# Patient Record
Sex: Male | Born: 1955 | Race: White | Hispanic: No | Marital: Single | Smoking: Never smoker
Health system: Southern US, Community
[De-identification: ages and names within clinical notes are randomized; demographics above are authoritative.]

## PROBLEM LIST (undated history)

## (undated) DIAGNOSIS — E785 Hyperlipidemia, unspecified: Secondary | ICD-10-CM

## (undated) DIAGNOSIS — I420 Dilated cardiomyopathy: Secondary | ICD-10-CM

## (undated) DIAGNOSIS — M1990 Unspecified osteoarthritis, unspecified site: Secondary | ICD-10-CM

## (undated) DIAGNOSIS — S86111A Strain of other muscle(s) and tendon(s) of posterior muscle group at lower leg level, right leg, initial encounter: Secondary | ICD-10-CM

## (undated) DIAGNOSIS — G4733 Obstructive sleep apnea (adult) (pediatric): Secondary | ICD-10-CM

## (undated) DIAGNOSIS — I493 Ventricular premature depolarization: Secondary | ICD-10-CM

## (undated) DIAGNOSIS — I480 Paroxysmal atrial fibrillation: Secondary | ICD-10-CM

## (undated) DIAGNOSIS — C61 Malignant neoplasm of prostate: Secondary | ICD-10-CM

## (undated) DIAGNOSIS — I10 Essential (primary) hypertension: Secondary | ICD-10-CM

## (undated) DIAGNOSIS — I341 Nonrheumatic mitral (valve) prolapse: Secondary | ICD-10-CM

## (undated) DIAGNOSIS — R931 Abnormal findings on diagnostic imaging of heart and coronary circulation: Secondary | ICD-10-CM

## (undated) HISTORY — PX: APPENDECTOMY: SHX54

## (undated) HISTORY — PX: TONSILLECTOMY: SUR1361

## (undated) HISTORY — DX: Essential (primary) hypertension: I10

## (undated) HISTORY — DX: Paroxysmal atrial fibrillation: I48.0

## (undated) HISTORY — DX: Obstructive sleep apnea (adult) (pediatric): G47.33

## (undated) HISTORY — PX: OTHER SURGICAL HISTORY: SHX169

## (undated) HISTORY — DX: Dilated cardiomyopathy: I42.0

## (undated) HISTORY — PX: PROSTATE BIOPSY: SHX241

## (undated) HISTORY — PX: TENDON TRANSPLANT: SHX2488

## (undated) HISTORY — DX: Hyperlipidemia, unspecified: E78.5

## (undated) HISTORY — DX: Abnormal findings on diagnostic imaging of heart and coronary circulation: R93.1

## (undated) HISTORY — DX: Nonrheumatic mitral (valve) prolapse: I34.1

## (undated) HISTORY — DX: Ventricular premature depolarization: I49.3

---

## 2005-12-18 ENCOUNTER — Emergency Department (HOSPITAL_COMMUNITY): Admission: EM | Admit: 2005-12-18 | Discharge: 2005-12-18 | Payer: Self-pay | Admitting: Emergency Medicine

## 2014-03-26 ENCOUNTER — Other Ambulatory Visit: Payer: Self-pay | Admitting: Orthopedic Surgery

## 2014-03-26 DIAGNOSIS — M542 Cervicalgia: Secondary | ICD-10-CM

## 2014-03-31 ENCOUNTER — Ambulatory Visit
Admission: RE | Admit: 2014-03-31 | Discharge: 2014-03-31 | Disposition: A | Discharge status: Home / Self Care | Payer: 59 | Source: Ambulatory Visit | Attending: Orthopedic Surgery | Admitting: Orthopedic Surgery

## 2014-03-31 DIAGNOSIS — M542 Cervicalgia: Secondary | ICD-10-CM

## 2014-05-12 ENCOUNTER — Encounter: Payer: Self-pay | Admitting: Cardiology

## 2014-05-12 ENCOUNTER — Ambulatory Visit (INDEPENDENT_AMBULATORY_CARE_PROVIDER_SITE_OTHER): Payer: 59 | Admitting: Cardiology

## 2014-05-12 VITALS — BP 114/70 | HR 88 | Ht 71.5 in | Wt 195.4 lb

## 2014-05-12 DIAGNOSIS — I10 Essential (primary) hypertension: Secondary | ICD-10-CM | POA: Insufficient documentation

## 2014-05-12 DIAGNOSIS — I493 Ventricular premature depolarization: Secondary | ICD-10-CM | POA: Insufficient documentation

## 2014-05-12 DIAGNOSIS — I420 Dilated cardiomyopathy: Secondary | ICD-10-CM | POA: Insufficient documentation

## 2014-05-12 DIAGNOSIS — I341 Nonrheumatic mitral (valve) prolapse: Secondary | ICD-10-CM

## 2014-05-12 NOTE — Patient Instructions (Signed)
Your physician has requested that you have an echocardiogram. Echocardiography is a painless test that uses sound waves to create images of your heart. It provides your doctor with information about the size and shape of your heart and how well your heart's chambers and valves are working. This procedure takes approximately one hour. There are no restrictions for this procedure.  Your physician wants you to follow-up in: 1 year with Dr. [name]. You will receive a reminder letter in the mail two months in advance. If you don't receive a letter, please call our office to schedule the follow-up appointment.  

## 2014-05-12 NOTE — Progress Notes (Signed)
  [place], [place], [place]  [address] Phone: [phone] Fax:  [phone]  Date:  05/12/2014   ID:  Jordan Payne, DOB 1955/11/18, MRN [id number]  PCP:  No primary care provider on file.  Cardiologist:  Fransico Him, MD    History of Present Illness: Jordan Payne is a 58 y.o. male with a history of MVP s/p MV repair, HTN and DCM who presents today for followup.  He is doing well.  He denies any chest pain, SOB, DOE, LE edema, dizziness, palpitations or syncope.     Wt Readings from Last 3 Encounters:  05/12/14 195 lb 6.4 oz (88.633 kg)     Past Medical History  Diagnosis Date  . Hypertension   . MVP (mitral valve prolapse)     s/p repair  . PVC's (premature ventricular contractions)   . DCM (dilated cardiomyopathy)     EF 41% by echo 2014    Current Outpatient Prescriptions  Medication Sig Dispense Refill  . lisinopril (PRINIVIL,ZESTRIL) 40 MG tablet Take 40 mg by mouth daily.    . pravastatin (PRAVACHOL) 40 MG tablet Take 40 mg by mouth daily.     No current facility-administered medications for this visit.    Allergies:   No Known Allergies  Social History:  The patient  reports that he has never smoked. He does not have any smokeless tobacco history on file. He reports that he does not drink alcohol or use illicit drugs.   Family History:  The patient's family history includes Deep vein thrombosis in his sister.   ROS:  Please see the history of present illness.      All other systems reviewed and negative.   PHYSICAL EXAM: VS:  BP 114/70 mmHg  Pulse 88  Ht 5' 11.5" (1.816 m)  Wt 195 lb 6.4 oz (88.633 kg)  BMI 26.88 kg/m2 Well nourished, well developed, in no acute distress HEENT: normal Neck: no JVD Cardiac:  normal S1, S2; RRR; no murmur Lungs:  clear to auscultation bilaterally, no wheezing, rhonchi or rales Abd: soft, nontender, no hepatomegaly Ext: no edema Skin: warm and dry Neuro:  CNs 2-12 intact, no focal abnormalities  noted  EKG: NSR with nonspecific T wave abnormality and PVC's      ASSESSMENT AND PLAN:  1. MVP s/p MV repair - doing well.   - will recheck 2D echo to assess MV 2. HTN well controlled - continue Lisinopril 3. DCM - with mild LV dysfunction EF 41% by echo 2014 - continue ACE I  Followup with me in 1 year  Signed, Fransico Him, MD [facility] 05/12/2014 2:03 PM

## 2014-05-20 ENCOUNTER — Ambulatory Visit (HOSPITAL_COMMUNITY): Payer: 59 | Attending: Cardiology | Admitting: Radiology

## 2014-05-20 DIAGNOSIS — I493 Ventricular premature depolarization: Secondary | ICD-10-CM

## 2014-05-20 DIAGNOSIS — I10 Essential (primary) hypertension: Secondary | ICD-10-CM

## 2014-05-20 DIAGNOSIS — I420 Dilated cardiomyopathy: Secondary | ICD-10-CM | POA: Diagnosis present

## 2014-05-20 DIAGNOSIS — I341 Nonrheumatic mitral (valve) prolapse: Secondary | ICD-10-CM

## 2014-05-20 NOTE — Progress Notes (Signed)
Echocardiogram performed.  

## 2014-05-25 ENCOUNTER — Encounter: Payer: Self-pay | Admitting: *Deleted

## 2014-05-28 ENCOUNTER — Telehealth: Payer: Self-pay

## 2014-05-28 DIAGNOSIS — I420 Dilated cardiomyopathy: Secondary | ICD-10-CM

## 2014-05-28 MED ORDER — CARVEDILOL 3.125 MG PO TABS
3.1250 mg | ORAL_TABLET | Freq: Two times a day (BID) | ORAL | Status: DC
Start: 2014-05-28 — End: 2014-06-18

## 2014-05-28 NOTE — Telephone Encounter (Signed)
-----   Message from Sueanne Margarita, MD sent at 05/21/2014  8:50 PM EST ----- Please let patient know that echo showed mild LV dysfunction with increased filing pressures.  Please have him come in for a BNP.  Also, please add Coreg 3.125mg  BID to help with diastolic dysfunction

## 2014-05-28 NOTE — Telephone Encounter (Signed)
Informed patient he has mild LV dysfunction and increased filling pressures.  Instructed patient to start Coreg 3.125 mg BID to help with diastolic dysfunction. Lab appointment for BNP made for Tuesday, November 24. Patient agrees with treatment plan.

## 2014-05-30 ENCOUNTER — Other Ambulatory Visit (INDEPENDENT_AMBULATORY_CARE_PROVIDER_SITE_OTHER): Payer: 59 | Admitting: *Deleted

## 2014-05-30 DIAGNOSIS — I420 Dilated cardiomyopathy: Secondary | ICD-10-CM

## 2014-05-30 LAB — BRAIN NATRIURETIC PEPTIDE: BRAIN NATRIURETIC PEPTIDE: 21.2 pg/mL (ref 0.0–100.0)

## 2014-06-03 ENCOUNTER — Other Ambulatory Visit: Payer: 59

## 2014-06-18 ENCOUNTER — Other Ambulatory Visit: Payer: Self-pay | Admitting: *Deleted

## 2014-06-18 MED ORDER — CARVEDILOL 3.125 MG PO TABS: 3.1250 mg | ORAL_TABLET | Freq: Two times a day (BID) | ORAL | Status: DC

## 2014-07-08 ENCOUNTER — Telehealth: Payer: Self-pay | Admitting: Cardiology

## 2014-07-08 DIAGNOSIS — R079 Chest pain, unspecified: Secondary | ICD-10-CM

## 2014-07-08 NOTE — Telephone Encounter (Signed)
Please order an ETT

## 2014-07-08 NOTE — Telephone Encounter (Signed)
New msg       Pt calling, had brief episode of chest pain on Saturday after exercising.   States it was very brief about 10 seconds.   Pt concerned he's having side effects of carvedilol.  Please call to discuss.

## 2014-07-08 NOTE — Telephone Encounter (Signed)
Pt st he has concerns about his cardiac state. He has been exercising more frequently recently and Saturday he went for a bike ride.  A few hours later, he had a short episode (5-10 sec) of chest pain. He st his BP spiked right after to 155/90, when it is usually 118/80. He st he rested for a few hours and was on his feet until 0230 in the morning Saturday and Sunday at a show. He st today he felt a little "off". But he has not had any CP. He is anxious to increase his exercise. He thinks his issue might be due to his carvedilol.  Patient wants to have testing done to see what happened. He is asymptomatic now and has been symptomatic since the episode Saturday.  To Dr. Radford Pax for review and recommendations.

## 2014-07-09 NOTE — Telephone Encounter (Signed)
Informed patient of Dr. Theodosia Blender recommendations and patient agrees. GXT ordered for scheduling.

## 2014-07-25 ENCOUNTER — Telehealth: Payer: Self-pay | Admitting: Cardiology

## 2014-07-25 DIAGNOSIS — R002 Palpitations: Secondary | ICD-10-CM

## 2014-07-25 NOTE — Telephone Encounter (Signed)
Please have him decrease Lisinopril to 20mg  daily and get a 30 day heart monitor to assess his palpitations.  Continue coreg for now due to LV dysfunction

## 2014-07-25 NOTE — Telephone Encounter (Signed)
Patient st that after he rides his bike he feels PVC's. He is concerned about his cardiac medications and thinks that his Coreg is causing his issues after researching on the Internet. He also thinks his lisinopril dose is too high, as his blood pressure was 96/60 the other day.  Instructed the patient to check his BP before he takes his medications and to hold the lisinopril if it is low until we follow-up with him.  Message sent to [facility] to see if there are any closer ETT openings than his.

## 2014-07-25 NOTE — Telephone Encounter (Signed)
New message     Patient calling still having issues after exercise & review test results.

## 2014-07-29 MED ORDER — LISINOPRIL 20 MG PO TABS: 20.0000 mg | ORAL_TABLET | Freq: Every day | ORAL | Status: DC

## 2014-07-29 NOTE — Telephone Encounter (Signed)
Instructed patient to DECREASE lisinopril to 20 mg daily and to continue cored due to LV dysfunction. 30 day event monitor ordered for scheduling. Patient agrees with treatment plan.

## 2014-07-30 ENCOUNTER — Ambulatory Visit (HOSPITAL_COMMUNITY)
Admission: RE | Admit: 2014-07-30 | Discharge: 2014-07-30 | Disposition: A | Discharge status: Home / Self Care | Payer: 59 | Source: Ambulatory Visit | Attending: Cardiology | Admitting: Cardiology

## 2014-07-30 DIAGNOSIS — R079 Chest pain, unspecified: Secondary | ICD-10-CM | POA: Diagnosis not present

## 2014-07-31 ENCOUNTER — Encounter: Payer: Self-pay | Admitting: *Deleted

## 2014-07-31 ENCOUNTER — Encounter (INDEPENDENT_AMBULATORY_CARE_PROVIDER_SITE_OTHER): Payer: 59

## 2014-07-31 DIAGNOSIS — R002 Palpitations: Secondary | ICD-10-CM

## 2014-07-31 NOTE — Progress Notes (Signed)
Patient ID: Jordan Payne, male   DOB: 1956/03/17, 59 y.o.   MRN: [id number] [employer] 30 day cardiac event monitor applied to patient.

## 2014-08-07 ENCOUNTER — Encounter (HOSPITAL_COMMUNITY): Payer: 59

## 2014-09-10 ENCOUNTER — Telehealth: Payer: Self-pay | Admitting: Cardiology

## 2014-09-10 NOTE — Telephone Encounter (Signed)
Please let patient know that heart monitor showed NSR with PVC's and PAC's which are benign.  HR ranged from 62 to 142 bpm.  Please find out if patient exercises around 5-6pm at night

## 2014-09-12 NOTE — Telephone Encounter (Signed)
Patient informed of monitor results and verbal understanding expressed.   Patient does work out every other day around 6 PM.  He is still concerned about symptoms he continues to have. He says he does not have chest pain, but does complain of "discomfort," especially about 3-4 hours after or the next day after working out. It is hardly ever during exercise. Patient wanted Dr. Radford Pax to know that when he had his MVR repair about 15 years ago, his doctor told him not to lift heavy weights.  He says he lifts 10 pound weights when he does lift. He does not know if this is causing the discomfort.    To Dr. Radford Pax for recommendations.

## 2014-09-14 NOTE — Telephone Encounter (Signed)
Did patient ever get stress test done?

## 2014-09-15 NOTE — Telephone Encounter (Signed)
Patient informed of stress test results and verbal understanding expressed.   Patient agrees to see his PCP to F/U on symptoms.  Patient c/o slight leg numbness every now and then and "weird dreams" that wake him up at night and blames Coreg.  He requests Dr. Radford Pax know his symptoms.

## 2014-09-15 NOTE — Telephone Encounter (Signed)
Patient's stress test was normal.  His symptoms may be from musculoskeletal etiology - please have his see his PCP

## 2014-09-15 NOTE — Telephone Encounter (Signed)
Stress test results found in 07/30/2014 encounter under "Linked documents" - view image.

## 2014-09-16 NOTE — Telephone Encounter (Signed)
I would have him see if PCP for the leg numbness.

## 2014-09-16 NOTE — Telephone Encounter (Signed)
Instructed patient to see PCP for leg numbness. Patient agrees with treatment plan.

## 2015-02-18 ENCOUNTER — Encounter: Payer: Self-pay | Admitting: Cardiology

## 2015-05-12 ENCOUNTER — Ambulatory Visit: Payer: 59

## 2015-06-28 ENCOUNTER — Other Ambulatory Visit: Payer: Self-pay | Admitting: Cardiology

## 2015-07-17 ENCOUNTER — Ambulatory Visit: Payer: 59 | Admitting: Cardiology

## 2015-09-07 ENCOUNTER — Encounter: Payer: Self-pay | Admitting: Cardiology

## 2015-09-07 DIAGNOSIS — I341 Nonrheumatic mitral (valve) prolapse: Secondary | ICD-10-CM | POA: Insufficient documentation

## 2015-09-07 NOTE — Progress Notes (Signed)
Cardiology Office Note   Date:  09/08/2015   ID:  Jordan Payne, DOB May 09, 1956, MRN [id number]  PCP:  No PCP Per Patient    Chief Complaint  Patient presents with  . Mitral Valve Prolapse  . Hypertension  . Cardiomyopathy      History of Present Illness: Jordan Payne is a 60 y.o. male with a history of MVP s/p MV repair, HTN and DCM who presents today for followup. He is doing well. He denies any chest pain, SOB, DOE, LE edema, dizziness, palpitations or syncope. He is trying to follow a strict diet.  He rides the bike and walks on the treadmill at the gym.  He is worried he may have some sleep apnea.  He snores at night and feels that he does not sleep well.  He feels tired on some days in the am.      Past Medical History  Diagnosis Date  . Hypertension   . MVP (mitral valve prolapse)     s/p repair  . PVC's (premature ventricular contractions)   . DCM (dilated cardiomyopathy) ([place])     EF 40-45% by echo 2015    History reviewed. No pertinent past surgical history.   Current Outpatient Prescriptions  Medication Sig Dispense Refill  . carvedilol (COREG) 3.125 MG tablet TAKE 1 TABLET TWICE A DAY 180 tablet 0  . Cholecalciferol (VITAMIN D) 2000 units CAPS Take 1 capsule by mouth daily.    Marland Kitchen GLUCOSAMINE CHONDROITIN COMPLX PO Take 1 tablet by mouth 2 (two) times daily. Dosage 1500-1200mg     . lisinopril (PRINIVIL,ZESTRIL) 20 MG tablet Take 1 tablet (20 mg total) by mouth daily.    . Multiple Vitamins-Minerals (MULTIVITAMIN & MINERAL PO) Take 1 tablet by mouth daily.    . pravastatin (PRAVACHOL) 40 MG tablet Take 40 mg by mouth daily.    Marland Kitchen tobramycin (TOBREX) 0.3 % ophthalmic solution ADMINISTER 1-2 DROPS TO BOTH EYES EVERY 4 HOURS FOR 7 DAYS  0   No current facility-administered medications for this visit.    Allergies:   Review of patient's allergies indicates no known allergies.    Social History:  The patient  reports that he has never  smoked. He does not have any smokeless tobacco history on file. He reports that he does not drink alcohol or use illicit drugs.   Family History:  The patient's family history includes Deep vein thrombosis in his sister.    ROS:  Please see the history of present illness.   Otherwise, review of systems are positive for none.   All other systems are reviewed and negative.    PHYSICAL EXAM: VS:  BP 130/88 mmHg  Pulse 70  Ht 5\' 11"  (1.803 m)  Wt 197 lb 6.4 oz (89.54 kg)  BMI 27.54 kg/m2 , BMI Body mass index is 27.54 kg/(m^2). GEN: Well nourished, well developed, in no acute distress HEENT: normal Neck: no JVD, carotid bruits, or masses Cardiac: RRR; no murmurs, rubs, or gallops,no edema  Respiratory:  clear to auscultation bilaterally, normal work of breathing GI: soft, nontender, nondistended, + BS MS: no deformity or atrophy Skin: warm and dry, no rash Neuro:  Strength and sensation are intact Psych: euthymic mood, full affect   EKG:  EKG was ordered today showing NSR with no ST changes    Recent Labs: No results found for requested labs within last 365 days.  Lipid Panel No results found for: CHOL, TRIG, HDL, CHOLHDL, VLDL, LDLCALC, LDLDIRECT    Wt Readings from Last 3 Encounters:  09/08/15 197 lb 6.4 oz (89.54 kg)  05/12/14 195 lb 6.4 oz (88.633 kg)     ASSESSMENT AND PLAN:  1. MVP s/p MV repair - doing well. - will recheck 2D echo to assess MV 2. HTN well controlled - continue Lisinopril/Coreg 3. DCM - with mild LV dysfunction EF 45% by echo 2015 - continue ACE I/BB   4.   Excessive daytime sleepiness - he has had some snoring as well and feels he is not sleeping as restful.  I will get a home sleep study.    Current medicines are reviewed at length with the patient today.  The patient does not have concerns regarding medicines.  The following changes have been made:  no change  Labs/ tests ordered today: See above Assessment and Plan No orders of  the defined types were placed in this encounter.     Disposition:   FU with me in 1 year  Signed, Sueanne Margarita, MD  09/08/2015 10:37 AM    [facility] [place], [place], [place]  [address] Phone: [phone]; Fax: [phone]

## 2015-09-08 ENCOUNTER — Encounter: Payer: Self-pay | Admitting: Cardiology

## 2015-09-08 ENCOUNTER — Ambulatory Visit (INDEPENDENT_AMBULATORY_CARE_PROVIDER_SITE_OTHER): Payer: 59 | Admitting: Cardiology

## 2015-09-08 VITALS — BP 130/88 | HR 70 | Ht 71.0 in | Wt 197.4 lb

## 2015-09-08 DIAGNOSIS — I10 Essential (primary) hypertension: Secondary | ICD-10-CM | POA: Diagnosis not present

## 2015-09-08 DIAGNOSIS — I341 Nonrheumatic mitral (valve) prolapse: Secondary | ICD-10-CM

## 2015-09-08 DIAGNOSIS — R0683 Snoring: Secondary | ICD-10-CM

## 2015-09-08 DIAGNOSIS — I420 Dilated cardiomyopathy: Secondary | ICD-10-CM

## 2015-09-08 DIAGNOSIS — G4719 Other hypersomnia: Secondary | ICD-10-CM

## 2015-09-08 NOTE — Patient Instructions (Signed)
Medication Instructions:  Your physician recommends that you continue on your current medications as directed. Please refer to the Current Medication list given to you today.   Labwork: None  Testing/Procedures: Your physician has requested that you have an echocardiogram. Echocardiography is a painless test that uses sound waves to create images of your heart. It provides your doctor with information about the size and shape of your heart and how well your heart's chambers and valves are working. This procedure takes approximately one hour. There are no restrictions for this procedure.  Dr. Radford Pax recommends you have a [place].  Follow-Up: Your physician wants you to follow-up in: 1 year with Dr. Radford Pax. You will receive a reminder letter in the mail two months in advance. If you don't receive a letter, please call our office to schedule the follow-up appointment.   Any Other Special Instructions Will Be Listed Below (If Applicable).     If you need a refill on your cardiac medications before your next appointment, please call your pharmacy.

## 2015-09-22 ENCOUNTER — Ambulatory Visit (HOSPITAL_COMMUNITY): Payer: 59

## 2015-09-23 ENCOUNTER — Ambulatory Visit (HOSPITAL_COMMUNITY): Payer: 59 | Attending: Cardiology

## 2015-09-23 ENCOUNTER — Other Ambulatory Visit: Payer: Self-pay

## 2015-09-23 DIAGNOSIS — I420 Dilated cardiomyopathy: Secondary | ICD-10-CM | POA: Diagnosis not present

## 2015-09-23 DIAGNOSIS — I119 Hypertensive heart disease without heart failure: Secondary | ICD-10-CM | POA: Diagnosis not present

## 2015-09-23 DIAGNOSIS — I77810 Thoracic aortic ectasia: Secondary | ICD-10-CM | POA: Insufficient documentation

## 2015-10-14 ENCOUNTER — Other Ambulatory Visit: Payer: Self-pay | Admitting: Cardiology

## 2015-11-26 ENCOUNTER — Emergency Department (HOSPITAL_COMMUNITY): Payer: 59

## 2015-11-26 ENCOUNTER — Encounter (HOSPITAL_COMMUNITY): Payer: Self-pay

## 2015-11-26 ENCOUNTER — Emergency Department (HOSPITAL_COMMUNITY)
Admission: EM | Admit: 2015-11-26 | Discharge: 2015-11-26 | Disposition: A | Discharge status: Home / Self Care | Payer: 59 | Attending: Emergency Medicine | Admitting: Emergency Medicine

## 2015-11-26 DIAGNOSIS — Z792 Long term (current) use of antibiotics: Secondary | ICD-10-CM | POA: Diagnosis not present

## 2015-11-26 DIAGNOSIS — R079 Chest pain, unspecified: Secondary | ICD-10-CM | POA: Insufficient documentation

## 2015-11-26 DIAGNOSIS — R0789 Other chest pain: Secondary | ICD-10-CM

## 2015-11-26 DIAGNOSIS — I10 Essential (primary) hypertension: Secondary | ICD-10-CM | POA: Insufficient documentation

## 2015-11-26 DIAGNOSIS — M549 Dorsalgia, unspecified: Secondary | ICD-10-CM | POA: Diagnosis not present

## 2015-11-26 DIAGNOSIS — Z79899 Other long term (current) drug therapy: Secondary | ICD-10-CM | POA: Insufficient documentation

## 2015-11-26 LAB — I-STAT TROPONIN, ED
TROPONIN I, POC: 0 ng/mL (ref 0.00–0.08)
TROPONIN I, POC: 0.01 ng/mL (ref 0.00–0.08)

## 2015-11-26 LAB — BASIC METABOLIC PANEL
Anion gap: 10 (ref 5–15)
BUN: 17 mg/dL (ref 6–20)
CO2: 25 mmol/L (ref 22–32)
Calcium: 9.2 mg/dL (ref 8.9–10.3)
Chloride: 102 mmol/L (ref 101–111)
Creatinine, Ser: 0.92 mg/dL (ref 0.61–1.24)
GFR calc Af Amer: >60 mL/min (ref >60)
Glucose, Bld: 101 mg/dL — ABNORMAL HIGH (ref 65–99)
POTASSIUM: 3.9 mmol/L (ref 3.5–5.1)
SODIUM: 137 mmol/L (ref 135–145)

## 2015-11-26 LAB — CBC
HEMATOCRIT: 47.8 % (ref 39.0–52.0)
Hemoglobin: 16.1 g/dL (ref 13.0–17.0)
MCH: 31.3 pg (ref 26.0–34.0)
MCHC: 33.7 g/dL (ref 30.0–36.0)
MCV: 92.8 fL (ref 78.0–100.0)
PLATELETS: 268 10*3/uL (ref 150–400)
RBC: 5.15 MIL/uL (ref 4.22–5.81)
RDW: 12.2 % (ref 11.5–15.5)
WBC: 8.7 10*3/uL (ref 4.0–10.5)

## 2015-11-26 MED ORDER — SODIUM CHLORIDE 0.9 % IV BOLUS (SEPSIS)
1000.0000 mL | Freq: Once | INTRAVENOUS | Status: AC
  Administered 2015-11-26: 1000 mL via INTRAVENOUS

## 2015-11-26 MED ORDER — LORAZEPAM 2 MG/ML IJ SOLN
0.5000 mg | Freq: Once | INTRAMUSCULAR | Status: AC
  Administered 2015-11-26: 0.5 mg via INTRAVENOUS
  Filled 2015-11-26: qty 1

## 2015-11-26 NOTE — ED Notes (Signed)
Patient here with chest pain  That started at 0700 this am. Describes as heaviness with no radiation. Thinks his elevated BP and CP related to taking prednisone for back strain. No pain on arrival

## 2015-11-26 NOTE — ED Provider Notes (Signed)
CSN: [id number]     Arrival date & time 11/26/15  [id number] History   First MD Initiated Contact with Patient 11/26/15 1001     Chief Complaint  Patient presents with  . Chest Pain     (Consider location/radiation/quality/duration/timing/severity/associated sxs/prior Treatment) Patient is a 60 y.o. male presenting with chest pain. The history is provided by the patient and medical records. No language interpreter was used.  Chest Pain Pain location:  L chest Pain quality comment:  Cramping Pain radiates to:  Does not radiate Pain radiates to the back: no   Pain severity:  Mild Onset quality:  Gradual Duration:  1 day Timing:  Intermittent Progression:  Waxing and waning Chronicity:  New Context comment:  Patient woke up from sleep this morning with chest discomfort. Worsened today at work during a conference call. Recent back injury after heavy lifting and has been taking prednisone taper. Patient feels chest pain may be musculoskeletal.  Relieved by:  Nothing Worsened by:  Nothing tried Ineffective treatments:  None tried Associated symptoms: back pain   Associated symptoms: no abdominal pain, no cough, no dizziness, no fatigue, no fever, no headache, no lower extremity edema, no nausea, no palpitations, no shortness of breath, no syncope and not vomiting   Associated symptoms comment:  Felt hot and flushed at work during chest pain Risk factors: high cholesterol, hypertension and male sex   Risk factors: no diabetes mellitus, no prior DVT/PE and no smoking     Past Medical History  Diagnosis Date  . Hypertension   . MVP (mitral valve prolapse)     s/p repair  . PVC's (premature ventricular contractions)   . DCM (dilated cardiomyopathy) ([place])     EF 40-45% by echo 2015   History reviewed. No pertinent past surgical history. Family History  Problem Relation Age of Onset  . Deep vein thrombosis Sister    Social History  Substance Use Topics  . Smoking status: Never Smoker    . Smokeless tobacco: None  . Alcohol Use: No    Review of Systems  Constitutional: Negative for fever and fatigue.  HENT: Negative for congestion and rhinorrhea.   Eyes: Negative for visual disturbance.  Respiratory: Negative for cough and shortness of breath.   Cardiovascular: Positive for chest pain. Negative for palpitations and syncope.  Gastrointestinal: Negative for nausea, vomiting, abdominal pain, diarrhea and constipation.  Genitourinary: Positive for frequency. Negative for dysuria and hematuria.  Musculoskeletal: Positive for back pain. Negative for neck pain.  Neurological: Negative for dizziness, syncope and headaches.  Psychiatric/Behavioral: Negative for confusion.      Allergies  Review of patient's allergies indicates no known allergies.  Home Medications   Prior to Admission medications   Medication Sig Start Date End Date Taking? Authorizing Provider  carvedilol (COREG) 3.125 MG tablet Take 1 tablet (3.125 mg total) by mouth 2 (two) times daily. 10/14/15   Sueanne Margarita, MD  Cholecalciferol (VITAMIN D) 2000 units CAPS Take 1 capsule by mouth daily.    Historical Provider, MD  GLUCOSAMINE CHONDROITIN COMPLX PO Take 1 tablet by mouth 2 (two) times daily. Dosage 1500-1200mg     Historical Provider, MD  lisinopril (PRINIVIL,ZESTRIL) 20 MG tablet Take 1 tablet (20 mg total) by mouth daily. 07/29/14   Sueanne Margarita, MD  Multiple Vitamins-Minerals (MULTIVITAMIN & MINERAL PO) Take 1 tablet by mouth daily.    Historical Provider, MD  pravastatin (PRAVACHOL) 40 MG tablet Take 40 mg by mouth daily.    Historical Provider,  MD  tobramycin (TOBREX) 0.3 % ophthalmic solution ADMINISTER 1-2 DROPS TO BOTH EYES EVERY 4 HOURS FOR 7 DAYS 08/29/15   Historical Provider, MD   BP 145/111 mmHg  Pulse 85  Temp(Src) 97.9 F (36.6 C) (Oral)  Resp 18  Ht 5\' 11"  (1.803 m)  Wt 86.183 kg  BMI 26.51 kg/m2  SpO2 100% Physical Exam  Constitutional: He is oriented to person, place, and  time. He appears well-developed and well-nourished.  HENT:  Head: Normocephalic and atraumatic.  Eyes: EOM are normal. Pupils are equal, round, and reactive to light.  Neck: Normal range of motion. Neck supple.  Cardiovascular: Normal rate and intact distal pulses.   No murmur heard. Pulmonary/Chest: Effort normal and breath sounds normal. No respiratory distress. He exhibits no tenderness.  Abdominal: Soft. He exhibits no distension. There is no tenderness.  Musculoskeletal: Normal range of motion. He exhibits no edema or tenderness.  Neurological: He is alert and oriented to person, place, and time.  Skin: Skin is warm and dry. No rash noted.  Psychiatric: He has a normal mood and affect.  Nursing note and vitals reviewed.   ED Course  Procedures (including critical care time) Labs Review Labs Reviewed  BASIC METABOLIC PANEL  [place], ED    Imaging Review No results found. I have personally reviewed and evaluated these images and lab results as part of my medical decision-making.   EKG Interpretation   Date/Time:  Thursday Nov 26 2015 10:00:52 EDT Ventricular Rate:  82 PR Interval:  140 QRS Duration: 96 QT Interval:  356 QTC Calculation: 415 R Axis:   65 Text Interpretation:  Sinus rhythm with Premature atrial complexes with  Abberant conduction Otherwise normal ECG non specific t wave change in v6  Otherwise no significant change Confirmed by FLOYD MD, Quillian Quince [id number]) on  11/26/2015 10:01:45 AM      MDM   Final diagnoses:  Chest pain of uncertain etiology    Patient is a 60 year old male with past medical history of hypertension, hyperlipidemia, Mitral valve repair who presents with chest pain since this morning. Had some associated flushing but no radiation, nausea, shortness of breath or other symptoms. Likely atypical chest pain. Patient is currently being treated with steroid taper for sciatic back pain after lifting a heavy table. He is  afebrile, normotensive with otherwise stable vital signs on arrival. Physical exam is unremarkable. No unilateral calf tenderness or swelling. Reports pain has mostly resolved at this time. Discomfort appears to be reproducible with certain positions. Patient is not tachypneic, tachycardic, or hypoxic and is not complaining of pleuritic chest pain. He does not have a prior history of bleeding or clotting problems. He is low risk for pulmonary embolism by Wells criteria. No leukocytosis, CXR changes, or symptoms of pneumonia or other cardiopulmonary infection on CXR. Because of age and risk factors of this patient ACS was ruled out cultures are pending. Troponins are unremarkable at 0 and 3 hours. Patient is tolerating by mouth daily without difficulty. At this time the cause of patient's chest is unclear, but doubt emergent causes. Likely musculoskeletal pain after overexertion. Patient discharged in stable condition. Strict return precautions were discussed, patient will return for worsening chest pain associated with syncope, shortness of breath, radiation to the back or arms or other new or worsening symptoms. Will follow up with his primary doctor for reevaluation.  Patient was seen and discussed with Dr. Tyrone Nine, ED attending.     Gibson Ramp, MD 11/26/15  [place], DO 11/27/15 1611

## 2015-11-26 NOTE — Discharge Instructions (Signed)

## 2016-08-26 ENCOUNTER — Encounter: Payer: Self-pay | Admitting: *Deleted

## 2016-09-07 ENCOUNTER — Ambulatory Visit (INDEPENDENT_AMBULATORY_CARE_PROVIDER_SITE_OTHER): Payer: 59 | Admitting: Cardiology

## 2016-09-07 ENCOUNTER — Encounter: Payer: Self-pay | Admitting: Cardiology

## 2016-09-07 VITALS — BP 130/98 | HR 72 | Ht 71.0 in | Wt 198.0 lb

## 2016-09-07 DIAGNOSIS — I10 Essential (primary) hypertension: Secondary | ICD-10-CM | POA: Diagnosis not present

## 2016-09-07 DIAGNOSIS — G4719 Other hypersomnia: Secondary | ICD-10-CM | POA: Diagnosis not present

## 2016-09-07 DIAGNOSIS — I420 Dilated cardiomyopathy: Secondary | ICD-10-CM | POA: Diagnosis not present

## 2016-09-07 DIAGNOSIS — I341 Nonrheumatic mitral (valve) prolapse: Secondary | ICD-10-CM | POA: Diagnosis not present

## 2016-09-07 MED ORDER — CARVEDILOL 6.25 MG PO TABS: 6.2500 mg | ORAL_TABLET | Freq: Two times a day (BID) | ORAL | 3 refills | Status: DC

## 2016-09-07 NOTE — Progress Notes (Signed)
Cardiology Office Note    Date:  09/07/2016   ID:  Jordan Payne, DOB 1956/05/30, MRN [id number]  PCP:  Melinda Crutch, MD  Cardiologist:  Fransico Him, MD   Chief Complaint  Patient presents with  . Follow-up    MVP, HTN, DCM    History of Present Illness:  Jordan Payne is a 61 y.o. male with a history of MVP s/p MV repair, HTN and DCM who presents today for followup. He is doing well. He denies any chest pain, SOB, DOE,  PND, orthopnea, gasping for breath at night, LE edema, dizziness, palpitations or syncope. He is trying to follow a strict diet.  He rides the bike and walks on the treadmill at the gym.  The last time I saw him he was worried he may have some sleep apnea.  He snores at night.  He had a home sleep study a year ago but we never got the results.    Past Medical History:  Diagnosis Date  . DCM (dilated cardiomyopathy) ([place])    EF 40-45% by echo 2015  . Hypertension   . MVP (mitral valve prolapse)    s/p repair  . PVC's (premature ventricular contractions)     No past surgical history on file.  Current Medications: Current Meds  Medication Sig  . carvedilol (COREG) 6.25 MG tablet Take 1 tablet (6.25 mg total) by mouth 2 (two) times daily.  . cetirizine (ZYRTEC) 10 MG tablet Take 10 mg by mouth daily.  . Cholecalciferol (VITAMIN D) 2000 units CAPS Take 1 capsule by mouth daily.  Marland Kitchen lisinopril (PRINIVIL,ZESTRIL) 20 MG tablet Take 1 tablet (20 mg total) by mouth daily.  Marland Kitchen MEGARED OMEGA-3 KRILL OIL 500 MG CAPS Take by mouth daily.  . Multiple Vitamins-Minerals (MULTIVITAMIN & MINERAL PO) Take 1 tablet by mouth daily.  . pravastatin (PRAVACHOL) 40 MG tablet Take 40 mg by mouth daily.  . [DISCONTINUED] carvedilol (COREG) 3.125 MG tablet Take 1 tablet (3.125 mg total) by mouth 2 (two) times daily.    Allergies:   Patient has no known allergies.   Social History   Social History  . Marital status: Single    Spouse name: N/A  . Number of children: N/A  .  Years of education: N/A   Social History Main Topics  . Smoking status: Never Smoker  . Smokeless tobacco: Never Used  . Alcohol use No  . Drug use: No  . Sexual activity: Not Asked   Other Topics Concern  . None   Social History Narrative  . None     Family History:  The patient's family history includes Deep vein thrombosis in his sister.   ROS:   Please see the history of present illness.    ROS All other systems reviewed and are negative.  No flowsheet data found.     PHYSICAL EXAM:   VS:  BP (!) 130/98   Pulse 72   Ht 5\' 11"  (1.803 m)   Wt 198 lb (89.8 kg)   BMI 27.62 kg/m    GEN: Well nourished, well developed, in no acute distress  HEENT: normal  Neck: no JVD, carotid bruits, or masses Cardiac: RRR; no murmurs, rubs, or gallops,no edema.  Intact distal pulses bilaterally.  Respiratory:  clear to auscultation bilaterally, normal work of breathing GI: soft, nontender, nondistended, + BS MS: no deformity or atrophy  Skin: warm and dry, no rash Neuro:  Alert and Oriented x 3, Strength and sensation are intact Psych:  euthymic mood, full affect  Wt Readings from Last 3 Encounters:  09/07/16 198 lb (89.8 kg)  11/26/15 190 lb (86.2 kg)  09/08/15 197 lb 6.4 oz (89.5 kg)      Studies/Labs Reviewed:   EKG:  EKG is ordered today.    Recent Labs: 11/26/2015: BUN 17; Creatinine, Ser 0.92; Hemoglobin 16.1; Platelets 268; Potassium 3.9; Sodium 137   Lipid Panel No results found for: CHOL, TRIG, HDL, CHOLHDL, VLDL, LDLCALC, LDLDIRECT  Additional studies/ records that were reviewed today include:  none    ASSESSMENT:    1. Mitral valve prolapse   2. Essential hypertension   3. DCM (dilated cardiomyopathy) ([place])   4. Excessive daytime sleepiness      PLAN:  In order of problems listed above:  1.  MVP s/p MV repair - doing well.   - will recheck 2D echo to assess MV  2.  HTN - BP borderline controlled.  He is not salting his food at home but eats  out a lot. He ate [place] yesterday.   - continue Lisinopril and BB. - Increase Coreg to 6.25mg  BID and followup in HTN clinic in 1 week.    3.  DCM - with mild LV dysfunction EF 41% by echo 2014 and normalized at 55% by echo 09/2015. - continue ACE I and BB.    4.  Excessive daytime sleepiness - He had a a home sleep study a year ago but the results were never sent to [place].  I will call the company to get the results and notify the patient     Medication Adjustments/Labs and Tests Ordered: Current medicines are reviewed at length with the patient today.  Concerns regarding medicines are outlined above.  Medication changes, Labs and Tests ordered today are listed in the Patient Instructions below.  Patient Instructions  Medication Instructions:  1) INCREASE COREG to 6.25 mg TWICE DAILY  Labwork: None  Testing/Procedures: None  Follow-Up: You have an appointment scheduled in the HTN CLINIC on Monday, September 19, 2016 at 2:00PM.  Your physician wants you to follow-up in: 1 year with Dr. Radford Pax. You will receive a reminder letter in the mail two months in advance. If you don't receive a letter, please call our office to schedule the follow-up appointment.   Any Other Special Instructions Will Be Listed Below (If Applicable). Please check your blood pressure daily and bring your readings to the appointment 3/12.    If you need a refill on your cardiac medications before your next appointment, please call your pharmacy.      Signed, Fransico Him, MD  09/07/2016 1:30 PM    [facility] [address], [place], [place]  [address] Phone: [phone]; Fax: [phone]

## 2016-09-07 NOTE — Patient Instructions (Addendum)
Medication Instructions:  1) INCREASE COREG to 6.25 mg TWICE DAILY  Labwork: None  Testing/Procedures: None  Follow-Up: You have an appointment scheduled in the HTN CLINIC on Monday, September 19, 2016 at 2:00PM.  Your physician wants you to follow-up in: 1 year with Dr. Radford Pax. You will receive a reminder letter in the mail two months in advance. If you don't receive a letter, please call our office to schedule the follow-up appointment.   Any Other Special Instructions Will Be Listed Below (If Applicable). Please check your blood pressure daily and bring your readings to the appointment 3/12.    If you need a refill on your cardiac medications before your next appointment, please call your pharmacy.

## 2016-09-12 ENCOUNTER — Telehealth: Payer: Self-pay | Admitting: *Deleted

## 2016-09-12 NOTE — Telephone Encounter (Signed)
Called the patient with his results, he verbalized understanding and stated he is not having ongoing daytime sleepiness but he is having the snoring

## 2016-09-12 NOTE — Telephone Encounter (Signed)
-----   Message from Sueanne Margarita, MD sent at 09/08/2016  4:22 PM EST ----- Normal home sleep study - if patient has ongoing daytime sleepiness and snoring then needs in lab sleep study to completely rule out OSA

## 2016-09-19 ENCOUNTER — Telehealth: Payer: Self-pay | Admitting: Pharmacist

## 2016-09-19 ENCOUNTER — Ambulatory Visit: Payer: 59

## 2016-09-19 NOTE — Progress Notes (Deleted)
Patient ID: Jordan Payne                 DOB: 1955-10-12                      MRN: [id number]     HPI: Bowe Sidor is a 61 y.o. male referred by Dr. Radford Pax to HTN clinic. PMH is significant for MVP s/p MV repair, HTN, and DCM (LVEF 55% on 09/2015 echo). At last visit, BP was elevated at 130/98 and pt was advised to increase his carvedilol to 6.25g BID. He presents today for follow up.   Current HTN meds: lisinopril 20mg  daily, carvedilol 6.25mg  BID BP goal: <130/58mmHg  Family History: DVT in his sister.  Social History: Denies tobacco, alcohol, and illicit drug use.  Diet:   Exercise: Rides the bike and walks on the treadmill at the gym  Home BP readings:   Wt Readings from Last 3 Encounters:  09/07/16 198 lb (89.8 kg)  11/26/15 190 lb (86.2 kg)  09/08/15 197 lb 6.4 oz (89.5 kg)   BP Readings from Last 3 Encounters:  09/07/16 (!) 130/98  11/26/15 122/91  09/08/15 130/88   Pulse Readings from Last 3 Encounters:  09/07/16 72  11/26/15 69  09/08/15 70    Renal function: CrCl cannot be calculated (Patient's most recent lab result is older than the maximum 21 days allowed.).  Past Medical History:  Diagnosis Date  . DCM (dilated cardiomyopathy) ([place])    EF 40-45% by echo 2015  . Hypertension   . MVP (mitral valve prolapse)    s/p repair  . PVC's (premature ventricular contractions)     Current Outpatient Prescriptions on File Prior to Visit  Medication Sig Dispense Refill  . carvedilol (COREG) 6.25 MG tablet Take 1 tablet (6.25 mg total) by mouth 2 (two) times daily. 180 tablet 3  . cetirizine (ZYRTEC) 10 MG tablet Take 10 mg by mouth daily.    . Cholecalciferol (VITAMIN D) 2000 units CAPS Take 1 capsule by mouth daily.    Marland Kitchen lisinopril (PRINIVIL,ZESTRIL) 20 MG tablet Take 1 tablet (20 mg total) by mouth daily.    Marland Kitchen MEGARED OMEGA-3 KRILL OIL 500 MG CAPS Take by mouth daily.    . methocarbamol (ROBAXIN) 500 MG tablet Take 500 mg by mouth every 6 (six) hours as  needed. Muscle spasms  0  . Multiple Vitamins-Minerals (MULTIVITAMIN & MINERAL PO) Take 1 tablet by mouth daily.    . pravastatin (PRAVACHOL) 40 MG tablet Take 40 mg by mouth daily.     No current facility-administered medications on file prior to visit.     No Known Allergies   Assessment/Plan:

## 2016-09-19 NOTE — Telephone Encounter (Signed)
Called pt to reschedule HTN visit for today due to the weather. He would like to send over a list of his BP readings from the past week since increasing his carvedilol dose. Will await BP readings and call pt to follow up.

## 2016-09-19 NOTE — Telephone Encounter (Signed)
Received print out of BP readings: 123/81, 125/73, 126/81, 112/76, 133/86, 112/74, 109/63, 115/80. All readings are at goal <130/74mmHg except 1. HR has ranged 63 - 77.   Confirmed pt is taking carvedilol 6.25mg  BID at 9 AM and 9 PM and lisinopril 20mg  at 9 AM.  He is recovering from a back injury and expects to be able to increase aerobic exercise soon. He also plans to keep monitoring his BP at home.  Spoke with pt and advised him that at this time, he should continue his current medications since his BP readings are consistently at goal. He will continue to monitor his BP at home and will call clinic with any further concerns.

## 2016-09-22 ENCOUNTER — Encounter: Payer: Self-pay | Admitting: Cardiology

## 2016-10-17 DIAGNOSIS — M25561 Pain in right knee: Secondary | ICD-10-CM | POA: Diagnosis not present

## 2016-11-16 DIAGNOSIS — M25562 Pain in left knee: Secondary | ICD-10-CM | POA: Diagnosis not present

## 2016-11-24 DIAGNOSIS — M25562 Pain in left knee: Secondary | ICD-10-CM | POA: Diagnosis not present

## 2016-11-24 DIAGNOSIS — M94262 Chondromalacia, left knee: Secondary | ICD-10-CM | POA: Diagnosis not present

## 2016-11-25 DIAGNOSIS — S60551A Superficial foreign body of right hand, initial encounter: Secondary | ICD-10-CM | POA: Diagnosis not present

## 2016-11-29 DIAGNOSIS — M48061 Spinal stenosis, lumbar region without neurogenic claudication: Secondary | ICD-10-CM | POA: Diagnosis not present

## 2017-01-09 DIAGNOSIS — M5416 Radiculopathy, lumbar region: Secondary | ICD-10-CM | POA: Diagnosis not present

## 2017-01-19 DIAGNOSIS — L84 Corns and callosities: Secondary | ICD-10-CM | POA: Diagnosis not present

## 2017-01-19 DIAGNOSIS — L601 Onycholysis: Secondary | ICD-10-CM | POA: Diagnosis not present

## 2017-02-07 DIAGNOSIS — M545 Low back pain: Secondary | ICD-10-CM | POA: Diagnosis not present

## 2017-02-16 DIAGNOSIS — M5416 Radiculopathy, lumbar region: Secondary | ICD-10-CM | POA: Diagnosis not present

## 2017-02-16 DIAGNOSIS — M9903 Segmental and somatic dysfunction of lumbar region: Secondary | ICD-10-CM | POA: Diagnosis not present

## 2017-02-16 DIAGNOSIS — M545 Low back pain: Secondary | ICD-10-CM | POA: Diagnosis not present

## 2017-02-21 DIAGNOSIS — M47816 Spondylosis without myelopathy or radiculopathy, lumbar region: Secondary | ICD-10-CM | POA: Diagnosis not present

## 2017-03-16 DIAGNOSIS — D3132 Benign neoplasm of left choroid: Secondary | ICD-10-CM | POA: Diagnosis not present

## 2017-03-30 DIAGNOSIS — R21 Rash and other nonspecific skin eruption: Secondary | ICD-10-CM | POA: Diagnosis not present

## 2017-03-30 DIAGNOSIS — Z23 Encounter for immunization: Secondary | ICD-10-CM | POA: Diagnosis not present

## 2017-04-24 DIAGNOSIS — I10 Essential (primary) hypertension: Secondary | ICD-10-CM | POA: Diagnosis not present

## 2017-04-24 DIAGNOSIS — Z0000 Encounter for general adult medical examination without abnormal findings: Secondary | ICD-10-CM | POA: Diagnosis not present

## 2017-04-24 DIAGNOSIS — E782 Mixed hyperlipidemia: Secondary | ICD-10-CM | POA: Diagnosis not present

## 2017-06-26 DIAGNOSIS — M76822 Posterior tibial tendinitis, left leg: Secondary | ICD-10-CM | POA: Diagnosis not present

## 2017-06-26 DIAGNOSIS — L84 Corns and callosities: Secondary | ICD-10-CM | POA: Diagnosis not present

## 2017-06-26 DIAGNOSIS — R2242 Localized swelling, mass and lump, left lower limb: Secondary | ICD-10-CM | POA: Diagnosis not present

## 2017-06-26 DIAGNOSIS — M76821 Posterior tibial tendinitis, right leg: Secondary | ICD-10-CM | POA: Diagnosis not present

## 2017-07-14 DIAGNOSIS — R239 Unspecified skin changes: Secondary | ICD-10-CM | POA: Diagnosis not present

## 2017-08-30 DIAGNOSIS — Z23 Encounter for immunization: Secondary | ICD-10-CM | POA: Diagnosis not present

## 2017-09-27 ENCOUNTER — Encounter: Payer: Self-pay | Admitting: Cardiology

## 2017-10-05 ENCOUNTER — Encounter: Payer: Self-pay | Admitting: Cardiology

## 2017-10-05 ENCOUNTER — Ambulatory Visit: Payer: 59 | Admitting: Cardiology

## 2017-10-05 VITALS — BP 116/82 | HR 82 | Ht 71.0 in | Wt 196.0 lb

## 2017-10-05 DIAGNOSIS — I10 Essential (primary) hypertension: Secondary | ICD-10-CM

## 2017-10-05 DIAGNOSIS — I493 Ventricular premature depolarization: Secondary | ICD-10-CM | POA: Diagnosis not present

## 2017-10-05 DIAGNOSIS — I341 Nonrheumatic mitral (valve) prolapse: Secondary | ICD-10-CM

## 2017-10-05 DIAGNOSIS — I420 Dilated cardiomyopathy: Secondary | ICD-10-CM | POA: Diagnosis not present

## 2017-10-05 MED ORDER — CARVEDILOL 6.25 MG PO TABS: 6.2500 mg | ORAL_TABLET | Freq: Two times a day (BID) | ORAL | 3 refills | Status: DC

## 2017-10-05 NOTE — Progress Notes (Signed)
Cardiology Office Note:    Date:  10/05/2017   ID:  Attilio Zeitler, DOB 62-16-1957, MRN [id number]  PCP:  Lawerance Cruel, MD  Cardiologist:  No primary care provider on file.    Referring MD: Lawerance Cruel, MD   Chief Complaint  Patient presents with  . Hypertension  . Mitral Regurgitation  . Cardiomyopathy    History of Present Illness:    Jordan Payne is a 62 y.o. male with a hx of MVP s/p MV repair, HTN and DCM (EF normalized to 55% on echo 09/23/2015).  He is here today for followup and is doing well.  He denies any chest pain or pressure, SOB, DOE, PND, orthopnea, LE edema, dizziness, palpitations or syncope. He is compliant with his meds and is tolerating meds with no SE.    Past Medical History:  Diagnosis Date  . DCM (dilated cardiomyopathy) ([place])    EF 40-45% by echo 2015  . Hypertension   . MVP (mitral valve prolapse)    s/p repair  . PVC's (premature ventricular contractions)     No past surgical history on file.  Current Medications: Current Meds  Medication Sig  . carvedilol (COREG) 6.25 MG tablet Take 6.25 mg by mouth 2 (two) times daily with a meal.  . cetirizine (ZYRTEC) 10 MG tablet Take 10 mg by mouth daily.  . Cholecalciferol (VITAMIN D) 2000 units CAPS Take 1 capsule by mouth daily.  . Coenzyme Q10 (COQ10) 100 MG CAPS Take 100 mg by mouth daily.  . Collagen-Boron-Hyaluronic Acid ([employer] JOINT HEALTH TRIPLE ACTION PO) Take 1 tablet by mouth daily.  Marland Kitchen lisinopril (PRINIVIL,ZESTRIL) 20 MG tablet Take 1 tablet (20 mg total) by mouth daily.  . methocarbamol (ROBAXIN) 500 MG tablet Take 500 mg by mouth every 6 (six) hours as needed. Muscle spasms  . Multiple Vitamins-Minerals (MULTIVITAMIN & MINERAL PO) Take 1 tablet by mouth daily.  . rosuvastatin (CRESTOR) 10 MG tablet Take 10 mg by mouth daily.  . TURMERIC PO Take 1,000 mg by mouth daily. (Liquid)     Allergies:   Patient has no known allergies.   Social History   Socioeconomic History  .  Marital status: Single    Spouse name: Not on file  . Number of children: Not on file  . Years of education: Not on file  . Highest education level: Not on file  Occupational History  . Not on file  Social Needs  . Financial resource strain: Not on file  . Food insecurity:    Worry: Not on file    Inability: Not on file  . Transportation needs:    Medical: Not on file    Non-medical: Not on file  Tobacco Use  . Smoking status: Never Smoker  . Smokeless tobacco: Never Used  Substance and Sexual Activity  . Alcohol use: No    Alcohol/week: 0.0 oz  . Drug use: No  . Sexual activity: Not on file  Lifestyle  . Physical activity:    Days per week: Not on file    Minutes per session: Not on file  . Stress: Not on file  Relationships  . Social connections:    Talks on phone: Not on file    Gets together: Not on file    Attends religious service: Not on file    Active member of club or organization: Not on file    Attends meetings of clubs or organizations: Not on file    Relationship status: Not on  file  Other Topics Concern  . Not on file  Social History Narrative  . Not on file     Family History: The patient's family history includes Deep vein thrombosis in his sister.  ROS:   Please see the history of present illness.    ROS  All other systems reviewed and negative.   EKGs/Labs/Other Studies Reviewed:    The following studies were reviewed today: none  EKG:  EKG is ordered today showing NSR at 82bpm with no ST changes  Recent Labs: No results found for requested labs within last 8760 hours.   Recent Lipid Panel No results found for: CHOL, TRIG, HDL, CHOLHDL, VLDL, LDLCALC, LDLDIRECT  Physical Exam:    VS:  BP 116/82   Pulse 82   Ht 5\' 11"  (1.803 m)   Wt 196 lb (88.9 kg)   BMI 27.34 kg/m     Wt Readings from Last 3 Encounters:  10/05/17 196 lb (88.9 kg)  09/07/16 198 lb (89.8 kg)  11/26/15 190 lb (86.2 kg)     GEN:  Well nourished, well  developed in no acute distress HEENT: Normal NECK: No JVD; No carotid bruits LYMPHATICS: No lymphadenopathy CARDIAC: RRR, no murmurs, rubs, gallops RESPIRATORY:  Clear to auscultation without rales, wheezing or rhonchi  ABDOMEN: Soft, non-tender, non-distended MUSCULOSKELETAL:  No edema; No deformity  SKIN: Warm and dry NEUROLOGIC:  Alert and oriented x 3 PSYCHIATRIC:  Normal affect   ASSESSMENT:    1. Mitral valve prolapse   2. DCM (dilated cardiomyopathy) ([place])   3. Essential hypertension   4. PVC's (premature ventricular contractions)    PLAN:    In order of problems listed above:  1.  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  2D echocardiogram 09/23/2015 showed stable mitral valve repair with mean mitral valve gradient 4 mmHg.  2.  Dilated cardiomyopathy -LV function has normalized to 55% on echo 2017.  3.  Hypertension -blood pressure well controlled on exam today.  He will continue on lisinopril 20 mg daily and carvedilol 6.25 mg twice daily.  4.  PVC's -asymptomatic   Medication Adjustments/Labs and Tests Ordered: Current medicines are reviewed at length with the patient today.  Concerns regarding medicines are outlined above.  Orders Placed This Encounter  Procedures  . EKG 12-Lead   No orders of the defined types were placed in this encounter.   Signed, Fransico Him, MD  10/05/2017 3:17 PM    [place]

## 2017-10-05 NOTE — Patient Instructions (Signed)
Medication Instructions:  Your physician recommends that you continue on your current medications as directed. Please refer to the Current Medication list given to you today.  If you need a refill on your cardiac medications, please contact your pharmacy first.  Labwork: None ordered   Testing/Procedures: None ordered   Follow-Up: Your physician wants you to follow-up in: 1 year with Dr. [name]. You will receive a reminder letter in the mail two months in advance. If you don't receive a letter, please call our office to schedule the follow-up appointment.  Any Other Special Instructions Will Be Listed Below (If Applicable).   Thank you for choosing [facility]    Rena Anira [place], RN  [phone]  If you need a refill on your cardiac medications before your next appointment, please call your pharmacy.   

## 2017-10-13 DIAGNOSIS — M5416 Radiculopathy, lumbar region: Secondary | ICD-10-CM | POA: Diagnosis not present

## 2017-10-31 DIAGNOSIS — M47816 Spondylosis without myelopathy or radiculopathy, lumbar region: Secondary | ICD-10-CM | POA: Diagnosis not present

## 2017-11-16 DIAGNOSIS — R062 Wheezing: Secondary | ICD-10-CM | POA: Diagnosis not present

## 2017-11-16 DIAGNOSIS — J208 Acute bronchitis due to other specified organisms: Secondary | ICD-10-CM | POA: Diagnosis not present

## 2017-11-23 DIAGNOSIS — R05 Cough: Secondary | ICD-10-CM | POA: Diagnosis not present

## 2017-11-23 DIAGNOSIS — R062 Wheezing: Secondary | ICD-10-CM | POA: Diagnosis not present

## 2017-11-23 DIAGNOSIS — R0989 Other specified symptoms and signs involving the circulatory and respiratory systems: Secondary | ICD-10-CM | POA: Diagnosis not present

## 2017-12-14 DIAGNOSIS — R062 Wheezing: Secondary | ICD-10-CM | POA: Diagnosis not present

## 2017-12-14 DIAGNOSIS — R0989 Other specified symptoms and signs involving the circulatory and respiratory systems: Secondary | ICD-10-CM | POA: Diagnosis not present

## 2017-12-14 DIAGNOSIS — R05 Cough: Secondary | ICD-10-CM | POA: Diagnosis not present

## 2018-03-01 DIAGNOSIS — M545 Low back pain: Secondary | ICD-10-CM | POA: Diagnosis not present

## 2018-03-05 DIAGNOSIS — M5416 Radiculopathy, lumbar region: Secondary | ICD-10-CM | POA: Diagnosis not present

## 2018-03-09 DIAGNOSIS — M47816 Spondylosis without myelopathy or radiculopathy, lumbar region: Secondary | ICD-10-CM | POA: Diagnosis not present

## 2018-03-15 DIAGNOSIS — D3132 Benign neoplasm of left choroid: Secondary | ICD-10-CM | POA: Diagnosis not present

## 2018-03-20 DIAGNOSIS — Q666 Other congenital valgus deformities of feet: Secondary | ICD-10-CM | POA: Diagnosis not present

## 2018-03-22 DIAGNOSIS — R05 Cough: Secondary | ICD-10-CM | POA: Diagnosis not present

## 2018-04-04 DIAGNOSIS — M47816 Spondylosis without myelopathy or radiculopathy, lumbar region: Secondary | ICD-10-CM | POA: Diagnosis not present

## 2018-04-09 DIAGNOSIS — L989 Disorder of the skin and subcutaneous tissue, unspecified: Secondary | ICD-10-CM | POA: Diagnosis not present

## 2018-04-09 DIAGNOSIS — B351 Tinea unguium: Secondary | ICD-10-CM | POA: Diagnosis not present

## 2018-04-10 DIAGNOSIS — H903 Sensorineural hearing loss, bilateral: Secondary | ICD-10-CM | POA: Diagnosis not present

## 2018-04-19 DIAGNOSIS — M47816 Spondylosis without myelopathy or radiculopathy, lumbar region: Secondary | ICD-10-CM | POA: Diagnosis not present

## 2018-04-23 DIAGNOSIS — H903 Sensorineural hearing loss, bilateral: Secondary | ICD-10-CM | POA: Diagnosis not present

## 2018-04-23 DIAGNOSIS — H9313 Tinnitus, bilateral: Secondary | ICD-10-CM | POA: Diagnosis not present

## 2018-05-04 DIAGNOSIS — Z0000 Encounter for general adult medical examination without abnormal findings: Secondary | ICD-10-CM | POA: Diagnosis not present

## 2018-05-04 DIAGNOSIS — M47816 Spondylosis without myelopathy or radiculopathy, lumbar region: Secondary | ICD-10-CM | POA: Diagnosis not present

## 2018-05-08 DIAGNOSIS — Z0000 Encounter for general adult medical examination without abnormal findings: Secondary | ICD-10-CM | POA: Diagnosis not present

## 2018-05-08 DIAGNOSIS — I10 Essential (primary) hypertension: Secondary | ICD-10-CM | POA: Diagnosis not present

## 2018-05-08 DIAGNOSIS — E782 Mixed hyperlipidemia: Secondary | ICD-10-CM | POA: Diagnosis not present

## 2018-05-21 DIAGNOSIS — E291 Testicular hypofunction: Secondary | ICD-10-CM | POA: Diagnosis not present

## 2018-05-21 DIAGNOSIS — L989 Disorder of the skin and subcutaneous tissue, unspecified: Secondary | ICD-10-CM | POA: Diagnosis not present

## 2018-05-29 DIAGNOSIS — M79672 Pain in left foot: Secondary | ICD-10-CM | POA: Diagnosis not present

## 2018-05-29 DIAGNOSIS — B351 Tinea unguium: Secondary | ICD-10-CM | POA: Diagnosis not present

## 2018-06-05 DIAGNOSIS — M47816 Spondylosis without myelopathy or radiculopathy, lumbar region: Secondary | ICD-10-CM | POA: Diagnosis not present

## 2018-07-23 DIAGNOSIS — S0011XA Contusion of right eyelid and periocular area, initial encounter: Secondary | ICD-10-CM | POA: Diagnosis not present

## 2018-09-07 DIAGNOSIS — H01009 Unspecified blepharitis unspecified eye, unspecified eyelid: Secondary | ICD-10-CM | POA: Diagnosis not present

## 2018-09-19 ENCOUNTER — Other Ambulatory Visit: Payer: Self-pay | Admitting: Cardiology

## 2018-09-19 ENCOUNTER — Other Ambulatory Visit: Payer: Self-pay

## 2018-09-19 MED ORDER — CARVEDILOL 6.25 MG PO TABS: 6.2500 mg | ORAL_TABLET | Freq: Two times a day (BID) | ORAL | 0 refills | Status: DC

## 2018-09-24 DIAGNOSIS — H01009 Unspecified blepharitis unspecified eye, unspecified eyelid: Secondary | ICD-10-CM | POA: Diagnosis not present

## 2018-10-08 ENCOUNTER — Telehealth: Payer: Self-pay | Admitting: Cardiology

## 2018-10-08 ENCOUNTER — Other Ambulatory Visit: Payer: Self-pay | Admitting: Cardiology

## 2018-10-08 MED ORDER — CARVEDILOL 6.25 MG PO TABS: 6.2500 mg | ORAL_TABLET | Freq: Two times a day (BID) | ORAL | 1 refills | Status: DC

## 2018-10-08 NOTE — Telephone Encounter (Signed)
New Message    *STAT* If patient is at the pharmacy, call can be transferred to refill team.   1. Which medications need to be refilled? (please list name of each medication and dose if known) carvedilol (COREG) 6.25 MG tablet   2. Which pharmacy/location (including street and city if local pharmacy) is medication to be sent to? [facility] [place] [place] [facility] Portal to Registered [employer] Sites  3. Do they need a 30 day or 90 day supply? [place]

## 2018-10-08 NOTE — Telephone Encounter (Signed)
TELEPHONE CALL NOTE  This patient has been deemed a candidate for follow-up tele-health visit to limit community exposure during the Covid-19 pandemic. I spoke with the patient via phone to discuss instructions. This has been outlined on the patient's AVS (dotphrase: [email or web address]). The patient was advised to review the section on consent for treatment as well. The patient will receive a phone call 2-3 days prior to their E-Visit at which time consent will be verbally confirmed. A Virtual Office Visit appointment type has been scheduled for 8 am on 10/08/18 with Dr. Radford Pax, with "VIDEO" or "TELEPHONE" in the appointment notes.  Sarina Ill, RN 10/08/2018 4:59 PM     Virtual Visit Pre-Appointment Phone Call  Steps For Call:  1. Confirm consent - "In the setting of the current Covid19 crisis, you are scheduled for a (phone or video) visit with your provider on (date) at (time).  Just as we do with many in-office visits, in order for you to participate in this visit, we must obtain consent.  If you'd like, I can send this to your mychart (if signed up) or email for you to review.  Otherwise, I can obtain your verbal consent now.  All virtual visits are billed to your insurance company just like a normal visit would be.  By agreeing to a virtual visit, we'd like you to understand that the technology does not allow for your provider to perform an examination, and thus may limit your provider's ability to fully assess your condition.  Finally, though the technology is pretty good, we cannot assure that it will always work on either your or our end, and in the setting of a video visit, we may have to convert it to a phone-only visit.  In either situation, we cannot ensure that we have a secure connection.  Are you willing to proceed?"  2. Give patient instructions for WebEx download to smartphone as below if video visit  3. Advise patient to be prepared with any vital sign or heart rhythm  information, their current medicines, and a piece of paper and pen handy for any instructions they may receive the day of their visit  4. Inform patient they will receive a phone call 15 minutes prior to their appointment time (may be from unknown caller ID) so they should be prepared to answer  5. Confirm that appointment type is correct in Epic appointment notes (video vs telephone)    TELEPHONE CALL NOTE  Jordan Payne has been deemed a candidate for a follow-up tele-health visit to limit community exposure during the Covid-19 pandemic. I spoke with the patient via phone to ensure availability of phone/video source, confirm preferred email & phone number, and discuss instructions and expectations.  I reminded Jordan Payne to be prepared with any vital sign and/or heart rhythm information that could potentially be obtained via home monitoring, at the time of his visit. I reminded Jordan Payne to expect a phone call at the time of his visit if his visit.  Did the patient verbally acknowledge consent to treatment? Webb, RN 10/08/2018 4:59 PM   CONSENT FOR TELE-HEALTH VISIT - PLEASE REVIEW  I hereby voluntarily request, consent and authorize [facility] and its employed or contracted physicians, physician assistants, nurse practitioners or other licensed health care professionals (the Practitioner), to provide me with telemedicine health care services (the "Services") as deemed necessary by the treating Practitioner. I acknowledge and consent to receive the Services by the Practitioner via telemedicine.  I understand that the telemedicine visit will involve communicating with the Practitioner through live audiovisual communication technology and the disclosure of certain medical information by electronic transmission. I acknowledge that I have been given the opportunity to request an in-person assessment or other available alternative prior to the telemedicine visit and am  voluntarily participating in the telemedicine visit.  I understand that I have the right to withhold or withdraw my consent to the use of telemedicine in the course of my care at any time, without affecting my right to future care or treatment, and that the Practitioner or I may terminate the telemedicine visit at any time. I understand that I have the right to inspect all information obtained and/or recorded in the course of the telemedicine visit and may receive copies of available information for a reasonable fee.  I understand that some of the potential risks of receiving the Services via telemedicine include:  Marland Kitchen Delay or interruption in medical evaluation due to technological equipment failure or disruption; . Information transmitted may not be sufficient (e.g. poor resolution of images) to allow for appropriate medical decision making by the Practitioner; and/or  . In rare instances, security protocols could fail, causing a breach of personal health information.  Furthermore, I acknowledge that it is my responsibility to provide information about my medical history, conditions and care that is complete and accurate to the best of my ability. I acknowledge that Practitioner's advice, recommendations, and/or decision may be based on factors not within their control, such as incomplete or inaccurate data provided by me or distortions of diagnostic images or specimens that may result from electronic transmissions. I understand that the practice of medicine is not an exact science and that Practitioner makes no warranties or guarantees regarding treatment outcomes. I acknowledge that I will receive a copy of this consent concurrently upon execution via email to the email address I last provided but may also request a printed copy by calling the office of [place].    I understand that my insurance will be billed for this visit.   I have read or had this consent read to me. . I understand the  contents of this consent, which adequately explains the benefits and risks of the Services being provided via telemedicine.  . I have been provided ample opportunity to ask questions regarding this consent and the Services and have had my questions answered to my satisfaction. . I give my informed consent for the services to be provided through the use of telemedicine in my medical care  By participating in this telemedicine visit I agree to the above.

## 2018-10-08 NOTE — Telephone Encounter (Signed)
New Message   Patient is calling because he received an email about changing his appt to a virtual visit. He is interested in doing that. Please call to discuss.

## 2018-10-10 ENCOUNTER — Telehealth (INDEPENDENT_AMBULATORY_CARE_PROVIDER_SITE_OTHER): Payer: 59 | Admitting: Cardiology

## 2018-10-10 ENCOUNTER — Encounter: Payer: Self-pay | Admitting: Cardiology

## 2018-10-10 ENCOUNTER — Other Ambulatory Visit: Payer: Self-pay

## 2018-10-10 VITALS — BP 130/85 | HR 68 | Ht 71.0 in | Wt 194.0 lb

## 2018-10-10 DIAGNOSIS — I420 Dilated cardiomyopathy: Secondary | ICD-10-CM

## 2018-10-10 DIAGNOSIS — I10 Essential (primary) hypertension: Secondary | ICD-10-CM

## 2018-10-10 DIAGNOSIS — I493 Ventricular premature depolarization: Secondary | ICD-10-CM

## 2018-10-10 DIAGNOSIS — I341 Nonrheumatic mitral (valve) prolapse: Secondary | ICD-10-CM

## 2018-10-10 MED ORDER — CARVEDILOL 6.25 MG PO TABS: 6.2500 mg | ORAL_TABLET | Freq: Two times a day (BID) | ORAL | 3 refills | Status: DC

## 2018-10-10 NOTE — Progress Notes (Signed)
Virtual Visit via Video Note    Evaluation Performed:  Follow-up visit  This visit type was conducted due to national recommendations for restrictions regarding the COVID-19 Pandemic (e.g. social distancing).  This format is felt to be most appropriate for this patient at this time.  All issues noted in this document were discussed and addressed.  No physical exam was performed (except for noted visual exam findings with Video Visits).  Please refer to the patient's chart (MyChart message for video visits and phone note for telephone visits) for the patient's consent to telehealth for [facility].  Date:  10/10/2018   ID:  Jordan Payne, DOB 07-28-1955, MRN [id number]  Patient Location:  Home  Provider location:   [place]  PCP:  Lawerance Cruel, MD  Cardiologist:  Fransico Him, MD Electrophysiologist:  None   Chief Complaint:  Mitral valve disease and HTN  History of Present Illness:    Jordan Payne is a 63 y.o. male who presents via audio/video conferencing for a telehealth visit today.    Jordan Payne is a 63 y.o. male with a hx of MVP s/p MV repair, HTN and DCM (EF normalized to 55% on echo 09/23/2015).  I am talking with him today through video televisit.  He is doing well.  He denies any chest pain or pressure, SOB, DOE, PND, orthopnea, LE edema, dizziness, palpitations or syncope. He is compliant with his meds and is tolerating meds with no SE.    The patient does not have symptoms concerning for COVID-19 infection (fever, chills, cough, or new shortness of breath).    Prior CV studies:   The following studies were reviewed today:  2D echo from 2017  Past Medical History:  Diagnosis Date  . DCM (dilated cardiomyopathy) ([place])    EF 40-45% by echo 2015.  EF 55% by echo 2017.  Jordan Payne Hypertension   . MVP (mitral valve prolapse)    s/p repair  . PVC's (premature ventricular contractions)    No past surgical history on file.   No outpatient medications have been  marked as taking for the 10/10/18 encounter (Telemedicine) with Sueanne Margarita, MD.     Allergies:   Patient has no known allergies.   Social History   Tobacco Use  . Smoking status: Never Smoker  . Smokeless tobacco: Never Used  Substance Use Topics  . Alcohol use: No    Alcohol/week: 0.0 standard drinks  . Drug use: No     Family Hx: The patient's family history includes Deep vein thrombosis in his sister.  ROS:   Please see the history of present illness.     All other systems reviewed and are negative.   Labs/Other Tests and Data Reviewed:    Recent Labs: No results found for requested labs within last 8760 hours.   Recent Lipid Panel No results found for: CHOL, TRIG, HDL, CHOLHDL, LDLCALC, LDLDIRECT  Wt Readings from Last 3 Encounters:  10/10/18 194 lb (88 kg)  10/05/17 196 lb (88.9 kg)  09/07/16 198 lb (89.8 kg)     Objective:    Vital Signs:  BP 130/85   Pulse 68   Ht 5\' 11"  (1.803 m)   Wt 194 lb (88 kg)   BMI 27.06 kg/m    Well nourished, well developed male in no acute distress. Well appearing, alert and conversant, regular work of breathing,  good skin color  Eyes- anicteric mouth- oral mucosa is pink  neuro- grossly intact skin- no apparent rash  or lesions or cyanosis   ASSESSMENT & PLAN:    1.  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  2D echocardiogram 09/23/2015 showed stable mitral valve repair with mean mitral valve gradient 4 mmHg. There was no MR.   He denies any SOB, PND orthopnea.  He has no LE edema.    2.  Dilated cardiomyopathy - this has resolved and EF at time of echo 09/2015 was 55%.    3.  Hypertension - He has been following his BP at home and is well controlled.  He will continue on Carvedilol 6.25mg  BID and Lisinopril 20mg  daily.  I will get a copy of his last BMET from his PCP.    4.  PVC's -he is asymptomatic and these are well suppressed on the Carvedilol.   COVID-19 Education: The signs and symptoms of COVID-19  were discussed with the patient and how to seek care for testing (follow up with PCP or arrange E-visit).  The importance of social distancing was discussed today.  Patient Risk:   After full review of this patient's clinical status, I feel that they are at least moderate risk at this time.  Time:   Today, I have spent 25 minutes with the patient with telehealth technology discussing heart disease and its risk for COVID, safe practices in the setting of COVID 19 crisis, review of prior echo in 2917, review of meds.     Medication Adjustments/Labs and Tests Ordered: Current medicines are reviewed at length with the patient today.  Concerns regarding medicines are outlined above.  Tests Ordered: No orders of the defined types were placed in this encounter.  Medication Changes: Meds ordered this encounter  Medications  . carvedilol (COREG) 6.25 MG tablet    Sig: Take 1 tablet (6.25 mg total) by mouth 2 (two) times daily with a meal.    Dispense:  180 tablet    Refill:  3    Disposition:  Follow up in 1 year(s)  Signed, Fransico Him, MD  10/10/2018 9:31 AM    [facility]

## 2018-10-10 NOTE — Patient Instructions (Signed)

## 2019-10-28 ENCOUNTER — Encounter: Payer: Self-pay | Admitting: Cardiology

## 2019-10-28 ENCOUNTER — Ambulatory Visit: Payer: 59 | Admitting: Cardiology

## 2019-10-28 ENCOUNTER — Other Ambulatory Visit: Payer: Self-pay

## 2019-10-28 VITALS — BP 138/90 | HR 74 | Ht 71.0 in | Wt 183.6 lb

## 2019-10-28 DIAGNOSIS — I10 Essential (primary) hypertension: Secondary | ICD-10-CM | POA: Diagnosis not present

## 2019-10-28 DIAGNOSIS — I341 Nonrheumatic mitral (valve) prolapse: Secondary | ICD-10-CM

## 2019-10-28 DIAGNOSIS — I420 Dilated cardiomyopathy: Secondary | ICD-10-CM | POA: Diagnosis not present

## 2019-10-28 DIAGNOSIS — I493 Ventricular premature depolarization: Secondary | ICD-10-CM

## 2019-10-28 NOTE — Patient Instructions (Signed)
Medication Instructions:  Your physician recommends that you continue on your current medications as directed. Please refer to the Current Medication list given to you today.  *If you need a refill on your cardiac medications before your next appointment, please call your pharmacy*  Testing/Procedures: Your physician has requested that you have an echocardiogram. Echocardiography is a painless test that uses sound waves to create images of your heart. It provides your doctor with information about the size and shape of your heart and how well your heart's chambers and valves are working. This procedure takes approximately one hour. There are no restrictions for this procedure.  Follow-Up: At [facility], you and your health needs are our priority.  As part of our continuing mission to provide you with exceptional heart care, we have created designated Provider Care Teams.  These Care Teams include your primary Cardiologist (physician) and Advanced Practice Providers (APPs -  Physician Assistants and Nurse Practitioners) who all work together to provide you with the care you need, when you need it.   Your next appointment:   1 year(s)  The format for your next appointment:   In Person  Provider:   Fransico Him, MD   Other Instructions Please take your blood pressure daily for one week and call us with your readings.

## 2019-10-28 NOTE — Progress Notes (Signed)
Cardiology Office Note:    Date:  10/28/2019   ID:  Jordan Payne, DOB 18-Mar-1956, MRN [id number]  PCP:  Lawerance Cruel, MD  Cardiologist:  Fransico Him, MD    Referring MD: Lawerance Cruel, MD   Chief Complaint  Patient presents with  . Mitral Regurgitation  . Cardiomyopathy  . Hypertension    History of Present Illness:    Jordan Payne is a 64 y.o. male with a hx of MVP s/p MV repair, HTN and DCM (EF normalized to 55% on echo 09/23/2015).  He is here today for followup and is doing well.  He denies any chest pain or pressure, SOB, DOE, PND, orthopnea, LE edema, dizziness, palpitations or syncope. He is compliant with his meds and is tolerating meds with no SE.    Past Medical History:  Diagnosis Date  . DCM (dilated cardiomyopathy) ([place])    EF 40-45% by echo 2015.  EF 55% by echo 2017.  Marland Kitchen Hypertension   . MVP (mitral valve prolapse)    s/p repair  . PVC's (premature ventricular contractions)     No past surgical history on file.  Current Medications: Current Meds  Medication Sig  . Ascorbic Acid (VITAMIN C) 1000 MG tablet Take 1,000 mg by mouth daily.  Marland Kitchen CALCIUM-VITAMIN D PO Take by mouth as directed.  . carvedilol (COREG) 6.25 MG tablet Take 1 tablet (6.25 mg total) by mouth 2 (two) times daily with a meal.  . cetirizine (ZYRTEC) 10 MG tablet Take 10 mg by mouth daily.  . Cholecalciferol (VITAMIN D) 2000 units CAPS Take 6,800 Units by mouth daily.   . Coenzyme Q10 (COQ10) 100 MG CAPS Take 100 mg by mouth daily.  . Collagen-Boron-Hyaluronic Acid (MOVE FREE ULTRA JOINT HEALTH PO) Take by mouth as directed.  Marland Kitchen lisinopril (PRINIVIL,ZESTRIL) 20 MG tablet Take 1 tablet (20 mg total) by mouth daily.  . Magnesium 400 MG CAPS Take 400 mg by mouth daily.  . Multiple Vitamins-Minerals (MULTIVITAMIN & MINERAL PO) Take 1 tablet by mouth daily.  . rosuvastatin (CRESTOR) 10 MG tablet Take 10 mg by mouth daily.  . TURMERIC PO Take 1,000 mg by mouth daily. (Liquid)      Allergies:   Patient has no known allergies.   Social History   Socioeconomic History  . Marital status: Single    Spouse name: Not on file  . Number of children: Not on file  . Years of education: Not on file  . Highest education level: Not on file  Occupational History  . Not on file  Tobacco Use  . Smoking status: Never Smoker  . Smokeless tobacco: Never Used  Substance and Sexual Activity  . Alcohol use: No    Alcohol/week: 0.0 standard drinks  . Drug use: No  . Sexual activity: Not on file  Other Topics Concern  . Not on file  Social History Narrative  . Not on file   Social Determinants of Health   Financial Resource Strain:   . Difficulty of Paying Living Expenses:   Food Insecurity:   . Worried About Charity fundraiser in the Last Year:   . Arboriculturist in the Last Year:   Transportation Needs:   . Film/video editor (Medical):   Marland Kitchen Lack of Transportation (Non-Medical):   Physical Activity:   . Days of Exercise per Week:   . Minutes of Exercise per Session:   Stress:   . Feeling of Stress :   Social  Connections:   . Frequency of Communication with Friends and Family:   . Frequency of Social Gatherings with Friends and Family:   . Attends Religious Services:   . Active Member of Clubs or Organizations:   . Attends Archivist Meetings:   Marland Kitchen Marital Status:      Family History: The patient's family history includes Deep vein thrombosis in his sister.  ROS:   Please see the history of present illness.    ROS  All other systems reviewed and negative.   EKGs/Labs/Other Studies Reviewed:    The following studies were reviewed today: none  EKG:  EKG is  ordered today.  The ekg ordered today demonstrates NSR with PVCs  Recent Labs: No results found for requested labs within last 8760 hours.   Recent Lipid Panel No results found for: CHOL, TRIG, HDL, CHOLHDL, VLDL, LDLCALC, LDLDIRECT  Physical Exam:    VS:  BP (!) 140/102    Pulse 74   Ht 5\' 11"  (1.803 m)   Wt 183 lb 9.6 oz (83.3 kg)   BMI 25.61 kg/m     Wt Readings from Last 3 Encounters:  10/28/19 183 lb 9.6 oz (83.3 kg)  10/10/18 194 lb (88 kg)  10/05/17 196 lb (88.9 kg)     GEN:  Well nourished, well developed in no acute distress HEENT: Normal NECK: No JVD; No carotid bruits LYMPHATICS: No lymphadenopathy CARDIAC: RRR, no murmurs, rubs, gallops RESPIRATORY:  Clear to auscultation without rales, wheezing or rhonchi  ABDOMEN: Soft, non-tender, non-distended MUSCULOSKELETAL:  No edema; No deformity  SKIN: Warm and dry NEUROLOGIC:  Alert and oriented x 3 PSYCHIATRIC:  Normal affect ASSESSMENT:      1. Mitral valve prolapse   2. DCM (dilated cardiomyopathy) ([place])   3. Essential hypertension   4. PVC's (premature ventricular contractions)    PLAN:    In order of problems listed above:  1. MVP with MR  - status post mitral valve repair.  -2D echocardiogram 09/23/2015 showed stable mitral valve repair with mean mitral valve gradient 4 mmHg.   2. Dilated cardiomyopathy - this has resolved and EF at time of echo 09/2015 was 55%.    3. Hypertension -BP elevated on exam today -continue Carvedilol 6.25mg  BID and Lisinopril to 20mg  daily -Creatinine was stable at 0.94 and K+ 4.8 in Dec 2020. -I have asked him to check his BP daily for a week and call with results. He tells me that his BP is normally in normal range at home and thinks he is anxious as he has not seen an MD in person in 2 years  4. PVC's - -denies any palpitations -continue BB   Medication Adjustments/Labs and Tests Ordered: Current medicines are reviewed at length with the patient today.  Concerns regarding medicines are outlined above.  Orders Placed This Encounter  Procedures  . EKG 12-Lead   No orders of the defined types were placed in this encounter.   Signed, Fransico Him, MD  10/28/2019 1:27 PM    [facility]

## 2019-11-13 ENCOUNTER — Ambulatory Visit (HOSPITAL_COMMUNITY): Payer: 59 | Attending: Cardiology

## 2019-11-13 ENCOUNTER — Other Ambulatory Visit: Payer: Self-pay

## 2019-11-13 DIAGNOSIS — I341 Nonrheumatic mitral (valve) prolapse: Secondary | ICD-10-CM | POA: Diagnosis present

## 2020-01-26 ENCOUNTER — Other Ambulatory Visit: Payer: Self-pay | Admitting: Cardiology

## 2020-04-05 ENCOUNTER — Encounter (HOSPITAL_COMMUNITY): Payer: Self-pay | Admitting: Emergency Medicine

## 2020-04-05 ENCOUNTER — Emergency Department (HOSPITAL_COMMUNITY)
Admission: EM | Admit: 2020-04-05 | Discharge: 2020-04-05 | Disposition: A | Discharge status: Home / Self Care | Payer: 59 | Attending: Emergency Medicine | Admitting: Emergency Medicine

## 2020-04-05 ENCOUNTER — Other Ambulatory Visit: Payer: Self-pay

## 2020-04-05 DIAGNOSIS — Z79899 Other long term (current) drug therapy: Secondary | ICD-10-CM | POA: Diagnosis not present

## 2020-04-05 DIAGNOSIS — I10 Essential (primary) hypertension: Secondary | ICD-10-CM | POA: Diagnosis not present

## 2020-04-05 DIAGNOSIS — R1030 Lower abdominal pain, unspecified: Secondary | ICD-10-CM | POA: Diagnosis not present

## 2020-04-05 DIAGNOSIS — R1110 Vomiting, unspecified: Secondary | ICD-10-CM | POA: Diagnosis not present

## 2020-04-05 DIAGNOSIS — R109 Unspecified abdominal pain: Secondary | ICD-10-CM | POA: Diagnosis present

## 2020-04-05 LAB — URINALYSIS, ROUTINE W REFLEX MICROSCOPIC
Bacteria, UA: NONE SEEN
Bilirubin Urine: NEGATIVE
Glucose, UA: NEGATIVE mg/dL
Hgb urine dipstick: NEGATIVE
Ketones, ur: NEGATIVE mg/dL
Leukocytes,Ua: NEGATIVE
Nitrite: NEGATIVE
Protein, ur: 100 mg/dL — AB
Specific Gravity, Urine: 1.019 (ref 1.005–1.030)
pH: 9 — ABNORMAL HIGH (ref 5.0–8.0)

## 2020-04-05 LAB — COMPREHENSIVE METABOLIC PANEL
ALT: 17 U/L (ref 0–44)
AST: 22 U/L (ref 15–41)
Albumin: 4.5 g/dL (ref 3.5–5.0)
Alkaline Phosphatase: 59 U/L (ref 38–126)
Anion gap: 14 (ref 5–15)
BUN: 13 mg/dL (ref 8–23)
CO2: 27 mmol/L (ref 22–32)
Calcium: 10.3 mg/dL (ref 8.9–10.3)
Chloride: 98 mmol/L (ref 98–111)
Creatinine, Ser: 0.99 mg/dL (ref 0.61–1.24)
GFR calc Af Amer: >60 mL/min (ref >60)
GFR calc non Af Amer: >60 mL/min (ref >60)
Glucose, Bld: 128 mg/dL — ABNORMAL HIGH (ref 70–99)
Potassium: 3.9 mmol/L (ref 3.5–5.1)
Sodium: 139 mmol/L (ref 135–145)
Total Bilirubin: 1.1 mg/dL (ref 0.3–1.2)
Total Protein: 7.1 g/dL (ref 6.5–8.1)

## 2020-04-05 LAB — CBC
HCT: 49 % (ref 39.0–52.0)
Hemoglobin: 16.4 g/dL (ref 13.0–17.0)
MCH: 31.6 pg (ref 26.0–34.0)
MCHC: 33.5 g/dL (ref 30.0–36.0)
MCV: 94.4 fL (ref 80.0–100.0)
Platelets: 330 10*3/uL (ref 150–400)
RBC: 5.19 MIL/uL (ref 4.22–5.81)
RDW: 11.9 % (ref 11.5–15.5)
WBC: 11.9 10*3/uL — ABNORMAL HIGH (ref 4.0–10.5)
nRBC: 0 % (ref 0.0–0.2)

## 2020-04-05 LAB — LIPASE, BLOOD: Lipase: 37 U/L (ref 11–51)

## 2020-04-05 MED ORDER — ACETAMINOPHEN 325 MG PO TABS
650.0000 mg | ORAL_TABLET | Freq: Once | ORAL | Status: AC
  Administered 2020-04-05: 650 mg via ORAL
  Filled 2020-04-05: qty 2

## 2020-04-05 MED ORDER — ONDANSETRON 4 MG PO TBDP
4.0000 mg | ORAL_TABLET | Freq: Once | ORAL | Status: AC
  Administered 2020-04-05: 4 mg via ORAL
  Filled 2020-04-05: qty 1

## 2020-04-05 NOTE — Discharge Instructions (Signed)
You were seen in the emergency department today for your abdominal pain.   We definitively identify the source of your abdominal pain, however your medical work-up in the emergency department today is reassuring.  It is important that you remain hydrated over the next few days, you should attempt to progress your diet from clear liquids to solids as tolerable.  Please follow-up with your primary care doctor in the next week for reexamination.  Please return to the emergency department immediately if you develop any worsening abdominal pain, intractable nausea, vomiting, diarrhea, chest pain, shortness of breath, dizziness or passing out.

## 2020-04-05 NOTE — ED Triage Notes (Signed)
Pt. Stated, I started having stomach pain last night and vomited one time.I have nausea still and the pain.

## 2020-04-05 NOTE — ED Provider Notes (Signed)
[facility] EMERGENCY DEPARTMENT Provider Note   CSN: [id number] Arrival date & time: 04/05/20  9562     History Chief Complaint  Patient presents with  . Abdominal Pain  . Emesis    Jordan Payne is a 64 y.o. male setting with about 12 hours of diffuse abdominal pain and nausea, started a few hours after he ate shrimp tacos from a food truck at a music festival yesterday. He reports he had a difficult time falling asleep last night, states he woke up early this morning and had one normal bowel movement, denies melena, hematochezia. He also states he had 2 episodes of nonbloody nonbilious emesis this morning. Denies fevers and chills. He reports he has not had any appetite, and has had very little to drink as well since yesterday evening. Reports his pain is waxing and waning, sharp in nature rather than crampy. He denies dysuria, hematuria.  Personally reviewed his medical record and found evidence of prior mitral valve prolapse with subsequent repair, dilated cardiomyopathy, and hypertension treated with lisinopril and carvedilol.  History of appendectomy as a teenager.  He says he has not had any antihypertensive medication yet today.  Has been vaccinated against COVID-19 earlier this year, and was tested earlier this month for COVID-19, as he was experiencing cold-like symptoms. Both tests were negative.  HPI     Past Medical History:  Diagnosis Date  . DCM (dilated cardiomyopathy) ([place])    EF 40-45% by echo 2015.  EF 55% by echo 2017.  Marland Kitchen Hypertension   . MVP (mitral valve prolapse)    s/p repair  . PVC's (premature ventricular contractions)     Patient Active Problem List   Diagnosis Date Noted  . Snoring 09/08/2015  . Excessive daytime sleepiness 09/08/2015  . Mitral valve prolapse 09/07/2015  . Hypertension   . PVC's (premature ventricular contractions)   . DCM (dilated cardiomyopathy) ([place])     History reviewed. No pertinent surgical  history.     Family History  Problem Relation Age of Onset  . Deep vein thrombosis Sister     Social History   Tobacco Use  . Smoking status: Never Smoker  . Smokeless tobacco: Never Used  Vaping Use  . Vaping Use: Never used  Substance Use Topics  . Alcohol use: No    Alcohol/week: 0.0 standard drinks  . Drug use: No    Home Medications Prior to Admission medications   Medication Sig Start Date End Date Taking? Authorizing Provider  Ascorbic Acid (VITAMIN C) 1000 MG tablet Take 1,000 mg by mouth daily.    [provider]  CALCIUM-VITAMIN D PO Take by mouth as directed.    [provider]  carvedilol (COREG) 6.25 MG tablet TAKE 1 TABLET TWICE DAILY  WITH MEALS 01/28/20   Sueanne Margarita, MD  cetirizine (ZYRTEC) 10 MG tablet Take 10 mg by mouth daily.    [provider]  Cholecalciferol (VITAMIN D) 2000 units CAPS Take 6,800 Units by mouth daily.     [provider]  Coenzyme Q10 (COQ10) 100 MG CAPS Take 100 mg by mouth daily.    [provider]  Collagen-Boron-Hyaluronic Acid (MOVE FREE ULTRA JOINT HEALTH PO) Take by mouth as directed.    [provider]  lisinopril (PRINIVIL,ZESTRIL) 20 MG tablet Take 1 tablet (20 mg total) by mouth daily. 07/29/14   Sueanne Margarita, MD  Magnesium 400 MG CAPS Take 400 mg by mouth daily.    [provider]  Multiple Vitamins-Minerals (MULTIVITAMIN & MINERAL PO) Take 1 tablet by mouth daily.    [provider]  rosuvastatin (CRESTOR) 10 MG tablet Take 10 mg by mouth daily. 09/26/17   [provider]  TURMERIC PO Take 1,000 mg by mouth daily. (Liquid)    [provider]    Allergies    Patient has no known allergies.  Review of Systems   Review of Systems  Constitutional: Negative for chills, fatigue and fever.  HENT: Negative.   Respiratory: Negative for cough, chest tightness and shortness of breath.   Cardiovascular: Negative for chest pain,  palpitations and leg swelling.  Gastrointestinal: Positive for abdominal distention, abdominal pain, nausea and vomiting. Negative for blood in stool.  Genitourinary: Negative for dysuria and hematuria.  Musculoskeletal: Negative.   Skin: Negative.   Hematological: Negative.     Physical Exam Updated Vital Signs BP (!) 132/96 (BP Location: Right Arm)   Pulse 89   Temp 98 F (36.7 C) (Oral)   Resp 20   Ht 6\' 2"  (1.88 m)   Wt 90.7 kg   SpO2 95%   BMI 25.68 kg/m   Physical Exam Vitals and nursing note reviewed.  Constitutional:      General: He is not in acute distress. HENT:     Head: Normocephalic and atraumatic.     Mouth/Throat:     Mouth: Mucous membranes are moist.     Pharynx: No oropharyngeal exudate or posterior oropharyngeal erythema.  Eyes:     General: No scleral icterus.       Right eye: No discharge.        Left eye: No discharge.     Conjunctiva/sclera: Conjunctivae normal.  Cardiovascular:     Rate and Rhythm: Normal rate and regular rhythm.     Pulses: Normal pulses.     Heart sounds: Normal heart sounds. No murmur heard.   Pulmonary:     Effort: Pulmonary effort is normal. No respiratory distress.     Breath sounds: Normal breath sounds. No wheezing or rales.  Abdominal:     General: Bowel sounds are normal. There is no distension.     Palpations: Abdomen is soft.     Tenderness: There is abdominal tenderness in the right lower quadrant and left lower quadrant. There is no guarding or rebound. Negative signs include Murphy's sign and McBurney's sign.    Musculoskeletal:        General: No deformity.     Cervical back: Neck supple.  Skin:    General: Skin is warm and dry.     Capillary Refill: Capillary refill takes less than 2 seconds.  Neurological:     General: No focal deficit present.     Mental Status: He is alert. Mental status is at baseline.  Psychiatric:        Mood and Affect: Mood normal.      ED Results / Procedures /  Treatments   Labs (all labs ordered are listed, but only abnormal results are displayed) Labs Reviewed  COMPREHENSIVE METABOLIC PANEL - Abnormal; Notable for the following components:      Result Value   Glucose, Bld 128 (*)    All other components within normal limits  CBC - Abnormal; Notable for the following components:   WBC 11.9 (*)    All other components within normal limits  URINALYSIS, ROUTINE W REFLEX MICROSCOPIC - Abnormal; Notable for the following components:   Color, Urine AMBER (*)  APPearance TURBID (*)    pH 9.0 (*)    Protein, ur 100 (*)    All other components within normal limits  LIPASE, BLOOD    EKG None  Radiology No results found.  Procedures Procedures (including critical care time)  Medications Ordered in ED Medications  acetaminophen (TYLENOL) tablet 650 mg (650 mg Oral Given 04/05/20 0951)  ondansetron (ZOFRAN-ODT) disintegrating tablet 4 mg (4 mg Oral Given 04/05/20 6962)    ED Course  I have reviewed the triage vital signs and the nursing notes.  Pertinent labs & imaging results that were available during my care of the patient were reviewed by me and considered in my medical decision making (see chart for details).  Clinical Course as of Apr 06 1307  Sun Apr 05, 2020  1055 On reexamination the patient he is resting comfortably in his hospital bed, no longer tachycardic with heart rate of 84 blood pressure 132/96.  Nominal exam significant for bilateral lower quadrant tenderness to deep palpation.   [RS]    Clinical Course User Index [RS] Daronte Shostak, Sharlene Dory   MDM Rules/Calculators/A&P                         64 year old male patient with 12 hours of generalized abdominal pain, nausea, 2 episodes of nonbloody nonbilious emesis.  The differential lower abdominal pain and nauseais broad and includes but is not limited to gasteroenteritis from infectious etiology (viral, or bacterial) including food-borne source, colitis, ileitis.  Noninfectious etiologies include but are not limited to IBS, IBD, appendicitis, lymphadenitis, antibiotic exposure, or toxin exposure.   Patient mildly tachycardic on intake to 106, hypertensive 167/121. (Has not taken his antihypertensive medication today.)  My exam he is resting comfortably in bed.  His heart rate is in the 90s, BP is improved.  On physical exam he is tender to deep palpation of bilateral lower quadrants however negative Murphy point tenderness negative McBurney point tenderness (remote appendectomy.  No rebound tenderness.  CBC remarkable for a mildly elevated white count of 11.9, CMP unremarkable.  UA unremarkable. Antiemetics and pain medication provided in the emergency department  At the time of my reexamination of the patient he is no longer feeling nauseous; his belly pain is improved.  He continues to be tender in the lower quadrants to deep palpation.  Shared decision making discussion with the patient:  given improvement of patient symptomatology while in the emergency department and the patient's reassuring laboratory studies, signs, and benign physical exam, I do not feel any further work-up is necessary in the emergency department at this time.  Armas voiced understanding of his results and is amenable to plan for discharge with close follow-up with his primary care physician. He may start with clear liquid diet and progress up as tolerated.  Strict return precautions were provided to the patient.  Each of his questions were answered to his expresses affection.   Final Clinical Impression(s) / ED Diagnoses Final diagnoses:  Lower abdominal pain    Rx / DC Orders ED Discharge Orders    None       Aura Dials 04/05/20 1308    Noemi Chapel, MD 04/11/20 1843

## 2020-10-09 ENCOUNTER — Encounter: Payer: Self-pay | Admitting: Medical Oncology

## 2020-10-09 NOTE — Progress Notes (Signed)
I called pt to introduce myself as the Prostate Nurse Navigator and the Coordinator of the [facility] [place].  1. I confirmed with the patient he is aware of his referral to the clinic 10/16/2020, arriving @ 8 am.    2. I discussed the format of the clinic and the physicians he will be seeing that day.  3. I discussed where the clinic is located and how to contact me.  4. I confirmed his address and informed him I would be mailing a packet of information and forms to be completed. I asked him to bring them with him the day of his appointment.   He voiced understanding of the above. I asked him to call me if he has any questions or concerns regarding his appointments or the forms he needs to complete.

## 2020-10-09 NOTE — Progress Notes (Signed)
Left a message requesting a return call to discuss referral to the [facility] 10/16/20.

## 2020-10-13 ENCOUNTER — Encounter: Payer: Self-pay | Admitting: Medical Oncology

## 2020-10-14 NOTE — Progress Notes (Signed)
GU Location of Tumor / Histology: prostatic adenocarcinoma  If Prostate Cancer, Gleason Score is (3 + 3) and PSA is (2.39). Prostate volume: 44.6 grams.  Jasaun Carn was referred by his PCP, Dr. Harrington Challenger, to Dr. Jeffie Pollock for further evaluation of a right prostate nodule and PSA of 2.39.  Biopsies of prostate (if applicable) revealed:   Past/Anticipated interventions by urology, if any: prostate biopsy, referral to [facility]  Past/Anticipated interventions by medical oncology, if any: no  Weight changes, if any: denies  Bowel/Bladder complaints, if YFR:TMYT 6. SHIM 25. He has mild-moderate LUTS and good erectile function. Denies dysuria, hematuria, urinary leakage or incontinence. Denies any bowel complaints.   Nausea/Vomiting, if any: denies  Pain issues, if any:  Yes, joint and back pain  SAFETY ISSUES:  Prior radiation? denies  Pacemaker/ICD? denies  Possible current pregnancy? no, male patient  Is the patient on methotrexate? denies  Current Complaints / other details:  65 year old male. Single. Resides in [place]. Retired Chief Financial Officer.

## 2020-10-15 ENCOUNTER — Encounter: Payer: Self-pay | Admitting: Medical Oncology

## 2020-10-15 NOTE — Progress Notes (Signed)
Spoke with patient to confirm appointment for [facility] 4/8, arriving @ 8 am. I reviewed [place] parking, registration and COVID protocol. I reminded him to bring his complete medical forms. He voiced understanding of the above.

## 2020-10-16 ENCOUNTER — Encounter: Payer: Self-pay | Admitting: General Practice

## 2020-10-16 ENCOUNTER — Ambulatory Visit
Admission: RE | Admit: 2020-10-16 | Discharge: 2020-10-16 | Disposition: A | Discharge status: Home / Self Care | Payer: 59 | Source: Ambulatory Visit | Attending: Radiation Oncology | Admitting: Radiation Oncology

## 2020-10-16 ENCOUNTER — Other Ambulatory Visit: Payer: Self-pay

## 2020-10-16 ENCOUNTER — Inpatient Hospital Stay: Payer: 59 | Attending: Oncology | Admitting: Oncology

## 2020-10-16 ENCOUNTER — Encounter: Payer: Self-pay | Admitting: Medical Oncology

## 2020-10-16 ENCOUNTER — Encounter: Payer: Self-pay | Admitting: Radiation Oncology

## 2020-10-16 VITALS — BP 139/109 | HR 74 | Ht 70.5 in | Wt 191.2 lb

## 2020-10-16 DIAGNOSIS — I420 Dilated cardiomyopathy: Secondary | ICD-10-CM | POA: Diagnosis not present

## 2020-10-16 DIAGNOSIS — I10 Essential (primary) hypertension: Secondary | ICD-10-CM | POA: Insufficient documentation

## 2020-10-16 DIAGNOSIS — I341 Nonrheumatic mitral (valve) prolapse: Secondary | ICD-10-CM | POA: Insufficient documentation

## 2020-10-16 DIAGNOSIS — I429 Cardiomyopathy, unspecified: Secondary | ICD-10-CM | POA: Diagnosis not present

## 2020-10-16 DIAGNOSIS — C61 Malignant neoplasm of prostate: Secondary | ICD-10-CM | POA: Insufficient documentation

## 2020-10-16 DIAGNOSIS — E785 Hyperlipidemia, unspecified: Secondary | ICD-10-CM | POA: Diagnosis not present

## 2020-10-16 DIAGNOSIS — J45909 Unspecified asthma, uncomplicated: Secondary | ICD-10-CM | POA: Insufficient documentation

## 2020-10-16 DIAGNOSIS — R011 Cardiac murmur, unspecified: Secondary | ICD-10-CM | POA: Insufficient documentation

## 2020-10-16 DIAGNOSIS — R351 Nocturia: Secondary | ICD-10-CM | POA: Insufficient documentation

## 2020-10-16 DIAGNOSIS — Z79899 Other long term (current) drug therapy: Secondary | ICD-10-CM | POA: Diagnosis not present

## 2020-10-16 HISTORY — DX: Unspecified osteoarthritis, unspecified site: M19.90

## 2020-10-16 HISTORY — DX: Malignant neoplasm of prostate: C61

## 2020-10-16 HISTORY — DX: Strain of other muscle(s) and tendon(s) of posterior muscle group at lower leg level, right leg, initial encounter: S86.111A

## 2020-10-16 NOTE — Progress Notes (Signed)
[place] Psychosocial Distress Screening Spiritual Care  Met with Jordan Payne in [facility] to introduce [facility] team/resources, reviewing distress screen per protocol.  The patient scored a 7 on the Psychosocial Distress Thermometer which indicates severe distress. Also assessed for distress and other psychosocial needs.   ONCBCN DISTRESS SCREENING 10/16/2020  Screening Type Initial Screening  Distress experienced in past week (1-10) 7  Emotional problem type Nervousness/Anxiety;Adjusting to illness  Spiritual/Religous concerns type Facing my mortality  Referral to support programs Yes   Jordan Payne is retired and has several interests, ranging from making music to supporting a neighbor who's going through cancer treatment, that keep him engaged with meaning-making, purpose, and contribution. Coping with past medical needs (mitral valve replacement, major foot surgery) has given him perspective and transferable coping tools.   Follow up needed: No. Per Jordan Payne, no other needs or concerns at this time. He is aware of ongoing chaplain availability and plans to reach out as needed/desired.   [place], [place], [facility] Pager [phone] Voicemail [phone]

## 2020-10-16 NOTE — Progress Notes (Signed)
                               Care Plan Summary  Name: Mr. Ewing Fandino DOB: January 25, 1956   Your Medical Team:   Urologist -  Dr. Raynelle Bring, [facility]  Radiation Oncologist - Dr. Tyler Pita, [facility]   Medical Oncologist - Dr. Zola Button, [place]  Recommendations: 1) Active surveillance   * These recommendations are based on information available as of today's consult.      Recommendations may change depending on the results of further tests or exams.  Next Steps: 1) Follow up with Dr. Jeffie Pollock as scheduled    When appointments need to be scheduled, you will be contacted by [facility] and/or [facility].  Questions?  Please do not hesitate to call Cira Rue, RN, BSN, OCN at [phone] any questions or concerns.  Shirlean Mylar is your Oncology Nurse Navigator and is available to assist you while you're receiving your medical care at [facility].

## 2020-10-16 NOTE — Progress Notes (Signed)
Reason for the request:    Prostate cancer  HPI: I was asked by Dr. Jeffie Pollock to evaluate Mr. Stamas for the evaluation of prostate cancer.  He is a 65 year old man in reasonably good health who was found to have an elevated PSA of 2.39.  He underwent evaluation by Dr. Jeffie Pollock and underwent a biopsy on August 07, 2020.  He was found to have a Gleason score 3+3 = 6 in 5 cores with low volume disease in all of these cores.  He denies any erectile dysfunction symptoms and has mild lower urinary tract symptoms predominantly nocturia and occasional frequency.  He remains active and continues to attempt activities of daily living.  He does not report any headaches, blurry vision, syncope or seizures. Does not report any fevers, chills or sweats.  Does not report any cough, wheezing or hemoptysis.  Does not report any chest pain, palpitation, orthopnea or leg edema.  Does not report any nausea, vomiting or abdominal pain.  Does not report any constipation or diarrhea.  Does not report any skeletal complaints.    Does not report frequency, urgency or hematuria.  Does not report any skin rashes or lesions. Does not report any heat or cold intolerance.  Does not report any lymphadenopathy or petechiae.  Does not report any anxiety or depression.  Remaining review of systems is negative.    Past Medical History:  Diagnosis Date  . DCM (dilated cardiomyopathy) ([place])    EF 40-45% by echo 2015.  EF 55% by echo 2017.  [place] Hypertension   . MVP (mitral valve prolapse)    s/p repair  . PVC's (premature ventricular contractions)   :  No past surgical history on file.:   Current Outpatient Medications:  .  Ascorbic Acid (VITAMIN C) 1000 MG tablet, Take 1,000 mg by mouth daily., Disp: , Rfl:  .  CALCIUM-VITAMIN D PO, Take by mouth as directed., Disp: , Rfl:  .  carvedilol (COREG) 6.25 MG tablet, TAKE 1 TABLET TWICE DAILY  WITH MEALS, Disp: 180 tablet, Rfl: 3 .  cetirizine (ZYRTEC) 10 MG tablet, Take 10 mg by mouth  daily., Disp: , Rfl:  .  Cholecalciferol (VITAMIN D) 2000 units CAPS, Take 6,800 Units by mouth daily. , Disp: , Rfl:  .  Coenzyme Q10 (COQ10) 100 MG CAPS, Take 100 mg by mouth daily., Disp: , Rfl:  .  Collagen-Boron-Hyaluronic Acid (MOVE FREE ULTRA JOINT HEALTH PO), Take by mouth as directed., Disp: , Rfl:  .  lisinopril (PRINIVIL,ZESTRIL) 20 MG tablet, Take 1 tablet (20 mg total) by mouth daily., Disp: , Rfl:  .  Magnesium 400 MG CAPS, Take 400 mg by mouth daily., Disp: , Rfl:  .  Multiple Vitamins-Minerals (MULTIVITAMIN & MINERAL PO), Take 1 tablet by mouth daily., Disp: , Rfl:  .  rosuvastatin (CRESTOR) 10 MG tablet, Take 10 mg by mouth daily., Disp: , Rfl:  .  TURMERIC PO, Take 1,000 mg by mouth daily. (Liquid), Disp: , Rfl: :  No Known Allergies:  Family History  Problem Relation Age of Onset  . Deep vein thrombosis Sister   :  Social History   Socioeconomic History  . Marital status: Single    Spouse name: Not on file  . Number of children: Not on file  . Years of education: Not on file  . Highest education level: Not on file  Occupational History  . Not on file  Tobacco Use  . Smoking status: Never Smoker  . Smokeless tobacco: Never  Used  Vaping Use  . Vaping Use: Never used  Substance and Sexual Activity  . Alcohol use: No    Alcohol/week: 0.0 standard drinks  . Drug use: No  . Sexual activity: Not on file  Other Topics Concern  . Not on file  Social History Narrative  . Not on file   Social Determinants of Health   Financial Resource Strain: Not on file  Food Insecurity: Not on file  Transportation Needs: Not on file  Physical Activity: Not on file  Stress: Not on file  Social Connections: Not on file  Intimate Partner Violence: Not on file  :  Pertinent items are noted in HPI.  Exam:  General appearance: alert and cooperative appeared without distress. Head: atraumatic without any abnormalities. Eyes: conjunctivae/corneas clear. PERRL.  Sclera  anicteric. Throat: lips, mucosa, and tongue normal; without oral thrush or ulcers. Resp: clear to auscultation bilaterally without rhonchi, wheezes or dullness to percussion. Cardio: regular rate and rhythm, S1, S2 normal, no murmur, click, rub or gallop GI: soft, non-tender; bowel sounds normal; no masses,  no organomegaly Skin: Skin color, texture, turgor normal. No rashes or lesions Lymph nodes: Cervical, supraclavicular, and axillary nodes normal. Neurologic: Grossly normal without any motor, sensory or deep tendon reflexes. Musculoskeletal: No joint deformity or effusion.    Assessment and Plan:   65 year old man with prostate cancer diagnosed in January 2022.  He is Gleason score 3+3 = 6 and 5 out of 12 cores with low volume disease and overall low risk disease with a PSA of 2.39.  His case was discussed today the prostate cancer multidisciplinary clinic including reviewing his pathology results with the reviewing pathologist.  Treatment options were discussed at this time with the patient.  Given his low risk disease active surveillance remains the standard of care at this time.  Definitive therapy with radiation and surgery were discussed as an alternative options if he opted against it.  The rationale for active surveillance as well as the logistics including repeat MRI imaging at times as well as repeat biopsy and essentially deferred treatment given his low volume disease.  The role for systemic therapy was discussed at this time and is limited given his low risk low-volume disease at this time.  All his questions were answered today to his satisfaction from a medical oncology standpoint.   45  minutes were dedicated to this visit. The time was spent on reviewing laboratory data,  discussing treatment options, discussing pathology results and answering questions regarding future plan.      A copy of this consult has been forwarded to the requesting physician.

## 2020-10-16 NOTE — Consult Note (Signed)
[facility]     10/16/2020   --------------------------------------------------------------------------------   Jordan Payne  MRN: [id number]  DOB: [birth year], 65 year old Male  SSN:    PRIMARY CARE:    REFERRING:  C Melinda Crutch, MD  PROVIDER:  Irine Seal, M.D.  TREATING:  Raynelle Bring, M.D.  LOCATION:  [facility] [phone]     --------------------------------------------------------------------------------   CC/HPI: CC: Prostate Cancer   Physician requesting consult: Dr. Irine Seal  PCP: Dr. Melinda Crutch  Location of consult: [facility] - Prostate Cancer Multidisciplinary [facility]   Mr. Jordan Payne is a 65 year old gentleman who was noted to have a prostate nodule by Dr. Harrington Challenger prompting urologic evaluation where he was confirmed to have a right mid prostate nodule on exam. His PSA was 2.39. A TRUS biopsy of the prostate by Dr. Jeffie Pollock on 08/07/20 indicated Gleason 3+3=6 adenocarcinoma with 5 out of 12 biopsies positive for malignancy.   Family history: None.   Imaging studies: None.   PMH: He has a history of hypertension, hyperlipidemia, and asthma. He also has a history of mitral valve prolapse and dilated cardiomyopathy s/p MVR. His last EF was 55%.  PSH: Mitral valve repair.   TNM stage: cT2a Nx Mx  PSA: 2.39  Gleason score: 3+3=6 (GG 1)  Biopsy (08/07/20): 5/12 cores positive  Left: L lateral apex (5%, 3+3=6), L apex (30%, 3+3=6), L mid (5%, 3+3=6)  Right: R apex (5%, 3+3=6), R lateral apex (5%, 3+3=6)  Prostate volume: 44.6 cc  PSAD: 0.05   Nomogram  OC disease: 76%  EPE: 20%  SVI: 1%  LNI: 1%  PFS (5 year, 10 year): 96%, 92%   Urinary function: IPSS is 6.  Erectile function: SHIM score is 25.     ALLERGIES: No Known Drug Allergies    MEDICATIONS: Lisinopril 20 mg tablet  Calcium  Carvedilol 6.25 mg tablet  Cetirizine Hcl 10 mg tablet  Olopatadine Hcl 0.2 % drops  Rosuvastatin Calcium 10 mg tablet  Vitamin  C  Vitamin D3     Notes: Calcium with Vit D  Magnesium   GU PSH: Prostate Needle Biopsy - 08/07/2020     NON-GU PSH: Extensive Ankle/heel Surgery Repair Mitral Valve Surgical Pathology, Gross And Microscopic Examination For Prostate Needle - 08/07/2020     GU PMH: Prostate Cancer, He has T2a Nx Mx Gleason 6 low risk prostate cancer. I have reviewed his options for therapy including AS, RP, EXRT, Seeds, Cryo and HIFU. I think he would be a good candidate for AS but also RALP, Seeds or EXRT. I am going to get an Oncotype Gx test run to help assess his risk further and will have him seen in the [facility]. - 09/16/2020 Prostate nodule w/ LUTS, The nodule was a collection of stones. His prostate volume is 44.3ml. He has mild/mod LUTS. - 09/16/2020, He has mild/mod LUTS with a right base prostate nodule and normal PSA. I discussed the implications of this finding and the need to proceed with a prostate [place] and biopsy. I have reviewed the risks of bleeding, infection and voiding difficulty. Levaquin sent for the prep. , - 06/17/2020 Weak Urinary Stream - 09/16/2020, - 08/03/2020, - 06/17/2020 Prostate nodule w/o LUTS - 08/07/2020, He has a stable nodule on exam and I don't see a relationship with the discomfort he is having but it is a possibility. His stream is a bit weaker. I will send tamsulosin and see if we can  get the biopsy moved up for him. I am also going to change the prebiopsy antibiotic to bactrim and rocephin since he has had tendon issues and is concerned about levaquin. , - 08/03/2020 Pelvic/perineal pain - 08/03/2020    NON-GU PMH: Arthritis Asthma Cardiac murmur, unspecified Heart disease, unspecified Hypercholesterolemia Hypertension Nonrheumatic mitral valve disorder, unspecified    FAMILY HISTORY: Heart Disease - Runs in Family heart failure - Father Kidney Failure - Mother stroke - Mother   SOCIAL HISTORY: Marital Status: Single Preferred Language: English; Race: White Current Smoking  Status: Patient has never smoked.   Tobacco Use Assessment Completed: Used Tobacco in last 30 days? Drinks 2 drinks per day.  Drinks 1 caffeinated drink per day. 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    VITAL SIGNS: None   MULTI-SYSTEM PHYSICAL EXAMINATION:    Constitutional: Well-nourished. No physical deformities. Normally developed. Good grooming.     Complexity of Data:  Lab Test Review:   PSA  Records Review:   Previous Patient Records   05/28/20  PSA  Total PSA 2.39 ng/ml    PROCEDURES: None   ASSESSMENT:      ICD-10 Details  1 GU:   Prostate Cancer - C61    PLAN:           Document Letter(s):  Created for Patient: Clinical Summary         Notes:   1. Low risk prostate cancer: Mr. Jordan Payne has undergone comprehensive counseling today with Dr. Alen Blew and Dr. Tammi Klippel already. We reviewed his situation in detail in the low risk nature of his prostate cancer. Taking into account his age and his life expectancy as well as his disease parameters, we discussed options for treatment of his prostate cancer. The patient was counseled about the natural history of prostate cancer and the standard treatment options that are available for prostate cancer. It was explained to him how his age and life expectancy, clinical stage, Gleason score, and PSA affect his prognosis, the decision to proceed with additional staging studies, as well as how that information influences recommended treatment strategies. We discussed the roles for active surveillance, radiation therapy, surgical therapy, androgen deprivation, as well as ablative therapy options for the treatment of prostate cancer as appropriate to his individual cancer situation. We discussed the risks and benefits of these options with regard to their impact on cancer control and also in terms of potential adverse events, complications, and impact on quality of life particularly related to urinary and sexual function. The patient was  encouraged to ask questions throughout the discussion today and all questions were answered to his stated satisfaction. In addition, the patient was provided with and/or directed to appropriate resources and literature for further education about prostate cancer and treatment options.   Very appropriately, he appears to be most interested in proceeding with active surveillance management. We did discuss ongoing surveillance and a confirmatory evaluation that likely should include at minimum an MRI and subsequent repeat biopsy sometime over the next 6-12 months. He feels very comfortable proceeding in this fashion in is scheduled to follow-up with Dr. Jeffie Pollock in June for further discussion and to set up ongoing management/care.   Cc: Dr. Melinda Crutch  Dr. Irine Seal  Dr. Zola Button  Dr. Tyler Pita         Next Appointment:      Next Appointment: 12/16/2020 01:45 PM    Appointment Type: Laboratory Appointment    Location: [facility] [phone]  Provider: Lab LAB    Reason for Visit: psa      E & M CODES: We spent 55 minutes dedicated to evaluation and management time, including face to face interaction, discussions on coordination of care, documentation, result review, and discussion with others as applicable.

## 2020-10-16 NOTE — Progress Notes (Signed)
Radiation Oncology         [phone] ________________________________  Multidisciplinary Prostate Cancer Clinic  Initial Radiation Oncology Consultation  Name: Jordan Payne MRN: [id number]  Date: 10/16/2020  DOB: 06-23-1956  MV:HQIO, Dwyane Luo, MD  Irine Seal, MD   REFERRING PHYSICIAN: Irine Seal, MD  DIAGNOSIS: 65 y.o. 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 Malignant neoplasm of prostate ([place])  C61     HISTORY OF PRESENT ILLNESS::Jordan Payne is a 65 y.o. gentleman.  He was noted to have a prostate nodule by his primary care physician, Dr. Harrington Challenger. PSA at that time was 2.39 and reportedly stable over the past several years.  Accordingly, he was referred for evaluation in urology by Dr. Jeffie Pollock on 06/17/20,  digital rectal examination was performed at that time revealing a 10 mm right base prostate nodule with firmness of right lateral prostate.  The patient proceeded to transrectal ultrasound with 12 biopsies of the prostate on 08/07/20.  The prostate volume measured 44.6 cc.  Out of 12 core biopsies, 5 were positive.  The maximum Gleason score was 3+3, and this was seen in all four apex cores and the left mid. Oncotype Dx testing was also performed and this confirmed low risk disease.    The patient reviewed the biopsy results with his urologist and he has kindly been referred today to the multidisciplinary prostate cancer clinic for presentation of pathology and radiology studies in our conference for discussion of potential radiation treatment options and clinical evaluation.    PREVIOUS RADIATION THERAPY: No  PAST MEDICAL HISTORY:  has a past medical history of Arthritis, DCM (dilated cardiomyopathy) ([place]), Hypertension, MVP (mitral valve prolapse), Prostate cancer ([place]), PVC's (premature ventricular contractions), and Tibialis posterior rupture, right, initial encounter.    PAST SURGICAL  HISTORY: Past Surgical History:  Procedure Laterality Date  . APPENDECTOMY    . PROSTATE BIOPSY    . robotic mitral valve repair    . TENDON TRANSPLANT     PT tendon transplant and calcaneal osteotomy  . TONSILLECTOMY      FAMILY HISTORY: family history includes Cancer in his mother; Deep vein thrombosis in his sister; Lymphoma in his maternal grandmother.  SOCIAL HISTORY:  reports that he has never smoked. He has never used smokeless tobacco. He reports that he does not drink alcohol and does not use drugs.  ALLERGIES: Patient has no known allergies.  MEDICATIONS:  Current Outpatient Medications  Medication Sig Dispense Refill  . Ascorbic Acid (VITAMIN C) 1000 MG tablet Take 1,000 mg by mouth daily.    . Calcium Carbonate+Vitamin D 600-200 MG-UNIT TABS 1 tablet with a meal    . carvedilol (COREG) 6.25 MG tablet TAKE 1 TABLET TWICE DAILY  WITH MEALS 180 tablet 3  . cetirizine (ZYRTEC) 10 MG tablet Take 10 mg by mouth daily.    . Cholecalciferol (VITAMIN D3) 1.25 MG (50000 UT) CAPS Take by mouth.    . Coenzyme Q10 (COQ10) 100 MG CAPS Take 100 mg by mouth daily.    . Glucos-Chond-Hyal Ac-Ca Fructo (MOVE FREE JOINT HEALTH ADVANCE) TABS See admin instructions.    Marland Kitchen lisinopril (PRINIVIL,ZESTRIL) 20 MG tablet Take 1 tablet (20 mg total) by mouth daily.    . Magnesium 400 MG CAPS Take 400 mg by mouth daily.    . Multiple Vitamins-Minerals (MULTIVITAMIN & MINERAL PO) Take 1 tablet by mouth daily.    Marland Kitchen  rosuvastatin (CRESTOR) 10 MG tablet Take 10 mg by mouth daily.    . TURMERIC PO Take 1,000 mg by mouth daily. (Liquid)     No current facility-administered medications for this encounter.    REVIEW OF SYSTEMS:  On review of systems, the patient reports that he is doing well overall. He denies any chest pain, shortness of breath, cough, fevers, chills, night sweats, unintended weight changes. He denies any bowel disturbances, and denies abdominal pain, nausea or vomiting. He denies any new  musculoskeletal or joint aches or pains. His IPSS was 6, indicating mild urinary symptoms. His SHIM was 25, indicating he does not have erectile dysfunction. A complete review of systems is obtained and is otherwise negative.   PHYSICAL EXAM:  Wt Readings from Last 3 Encounters:  10/16/20 191 lb 3.2 oz (86.7 kg)  04/05/20 200 lb (90.7 kg)  10/28/19 183 lb 9.6 oz (83.3 kg)   Temp Readings from Last 3 Encounters:  04/05/20 98 F (36.7 C) (Oral)  11/26/15 98.2 F (36.8 C) (Oral)   BP Readings from Last 3 Encounters:  10/16/20 (!) 139/109  04/05/20 (!) 132/96  10/28/19 138/90   Pulse Readings from Last 3 Encounters:  10/16/20 74  04/05/20 89  10/28/19 74   Pain Assessment Pain Score: 2  Pain Frequency: Constant Pain Loc: Back (back and joint pain)/10  In general this is a well appearing Caucasian male in no acute distress. He is alert and oriented x4 and appropriate throughout the examination. HEENT reveals that the patient is normocephalic, atraumatic. EOMs are intact. PERRLA. Skin is intact without any evidence of gross lesions. Cardiopulmonary assessment is negative for acute distress and he exhibits normal effort. The abdomen is soft, non tender, non distended. Lower extremities are negative for pretibial pitting edema, deep calf tenderness, cyanosis or clubbing.  KPS = 100  100 - Normal; no complaints; no evidence of disease. 90   - Able to carry on normal activity; minor signs or symptoms of disease. 80   - Normal activity with effort; some signs or symptoms of disease. 57   - Cares for self; unable to carry on normal activity or to do active work. 60   - Requires occasional assistance, but is able to care for most of his personal needs. 50   - Requires considerable assistance and frequent medical care. 72   - Disabled; requires special care and assistance. 55   - Severely disabled; hospital admission is indicated although death not imminent. 25   - Very sick; hospital  admission necessary; active supportive treatment necessary. 10   - Moribund; fatal processes progressing rapidly. 0     - Dead  Karnofsky DA, Abelmann Milan, Craver LS and Burchenal [facility] [phone]) The use of the nitrogen mustards in the palliative treatment of carcinoma: with particular reference to bronchogenic carcinoma Cancer 1 634-56   LABORATORY DATA:  Lab Results  Component Value Date   WBC 11.9 (H) 04/05/2020   HGB 16.4 04/05/2020   HCT 49.0 04/05/2020   MCV 94.4 04/05/2020   PLT 330 04/05/2020   Lab Results  Component Value Date   NA 139 04/05/2020   K 3.9 04/05/2020   CL 98 04/05/2020   CO2 27 04/05/2020   Lab Results  Component Value Date   ALT 17 04/05/2020   AST 22 04/05/2020   ALKPHOS 59 04/05/2020   BILITOT 1.1 04/05/2020     RADIOGRAPHY: No results found.    IMPRESSION/PLAN: 65 y.o. gentleman with Stage  T2a adenocarcinoma of the prostate with a Gleason score of 3+3 and a PSA of 2.39.    We discussed the patient's workup and outlined the nature of prostate cancer in this setting. The patient's T stage, Gleason's score, and PSA put him into the low risk group. Accordingly, he is eligible for a variety of potential treatment options including active surveillance, brachytherapy, 5.5 weeks of external radiation, or prostatectomy. We discussed the available radiation techniques, and focused on the details and logistics of delivery. We discussed and outlined the risks, benefits, short and long-term effects associated with radiotherapy and compared and contrasted these with prostatectomy. We discussed the role of SpaceOAR gel in reducing the rectal toxicity associated with radiotherapy. Given his low volume, low risk disease, our recommendation is to proceed with active surveillance. He understands that active surveillance consists of close monitoring of the PS as well as periodic repeat prostate biopsy and/or prostate MRI.  He appears to have a good understanding of his disease and  our treatment recommendations.  He was encouraged to ask questions that were answered to his stated satisfaction.  At the end of the conversation, the patient is interested in moving forward with active surveillance. We will share our discussion with Dr. Jeffie Pollock and look forward to following along in the care of this very nice gentleman.     Nicholos Johns, [name]    Tyler Pita, MD  [facility] Direct Dial: [phone]  Fax: [phone] [email or web address]  Skype  [email or web address]   This document serves as a record of services personally performed by Tyler Pita, MD and Freeman Caldron, [name]. It was created on their behalf by Wilburn Mylar, a trained medical scribe. The creation of this record is based on the scribe's personal observations and the provider's statements to them. This document has been checked and approved by the attending provider.

## 2020-10-27 ENCOUNTER — Ambulatory Visit: Payer: 59 | Admitting: Cardiology

## 2020-10-27 ENCOUNTER — Other Ambulatory Visit: Payer: Self-pay

## 2020-10-27 ENCOUNTER — Encounter: Payer: Self-pay | Admitting: Cardiology

## 2020-10-27 VITALS — BP 132/86 | HR 73 | Ht 70.5 in | Wt 187.8 lb

## 2020-10-27 DIAGNOSIS — I493 Ventricular premature depolarization: Secondary | ICD-10-CM | POA: Diagnosis not present

## 2020-10-27 DIAGNOSIS — I420 Dilated cardiomyopathy: Secondary | ICD-10-CM | POA: Diagnosis not present

## 2020-10-27 DIAGNOSIS — I10 Essential (primary) hypertension: Secondary | ICD-10-CM

## 2020-10-27 DIAGNOSIS — I341 Nonrheumatic mitral (valve) prolapse: Secondary | ICD-10-CM | POA: Diagnosis not present

## 2020-10-27 NOTE — Progress Notes (Signed)
Cardiology Office Note:    Date:  10/27/2020   ID:  Jordan Payne, DOB [birth year], MRN [id number]  PCP:  Lawerance Cruel, MD  Cardiologist:  Fransico Him, MD    Referring MD: Lawerance Cruel, MD   Chief Complaint  Patient presents with  . Mitral Regurgitation  . Hypertension  . Cardiomyopathy    History of Present Illness:    Jordan Payne is a 65 y.o. male with a hx of MVP s/p MV repair, HTN and DCM (EF normalized to 55% on echo 09/23/2015).  He is here today for followup and is doing well.  He denies any chest pain or pressure, SOB, DOE, PND, orthopnea, LE edema, dizziness, palpitations or syncope. He is compliant with his meds and is tolerating meds with no SE.    Past Medical History:  Diagnosis Date  . Arthritis   . DCM (dilated cardiomyopathy) ([place])    EF 40-45% by echo 2015.  EF 55% by echo 2017. EF 50-55% by echo 2021  . Hypertension   . MVP (mitral valve prolapse)    s/p repair  . Prostate cancer ([place])   . PVC's (premature ventricular contractions)   . Tibialis posterior rupture, right, initial encounter    right foot    Past Surgical History:  Procedure Laterality Date  . APPENDECTOMY    . PROSTATE BIOPSY    . robotic mitral valve repair    . TENDON TRANSPLANT     PT tendon transplant and calcaneal osteotomy  . TONSILLECTOMY      Current Medications: Current Meds  Medication Sig  . Ascorbic Acid (VITAMIN C) 1000 MG tablet Take 1,000 mg by mouth daily.  . Calcium Carbonate+Vitamin D 600-200 MG-UNIT TABS 1 tablet with a meal  . carvedilol (COREG) 6.25 MG tablet TAKE 1 TABLET TWICE DAILY  WITH MEALS  . cetirizine (ZYRTEC) 10 MG tablet Take 10 mg by mouth daily.  . Cholecalciferol (VITAMIN D3) 1.25 MG (50000 UT) CAPS Take by mouth.  . Coenzyme Q10 (COQ10) 100 MG CAPS Take 100 mg by mouth daily.  . Glucos-Chond-Hyal Ac-Ca Fructo (MOVE FREE JOINT HEALTH ADVANCE) TABS See admin instructions.  Marland Kitchen lisinopril (PRINIVIL,ZESTRIL) 20 MG tablet Take 1 tablet (20  mg total) by mouth daily.  . Magnesium 400 MG CAPS Take 400 mg by mouth daily.  . Multiple Vitamins-Minerals (MULTIVITAMIN & MINERAL PO) Take 1 tablet by mouth daily.  . rosuvastatin (CRESTOR) 10 MG tablet Take 10 mg by mouth daily.  . TURMERIC PO Take 1,000 mg by mouth daily. (Liquid)     Allergies:   Patient has no known allergies.   Social History   Socioeconomic History  . Marital status: Single    Spouse name: Not on file  . Number of children: 0  . Years of education: Not on file  . Highest education level: Not on file  Occupational History  . Occupation: Chief Financial Officer    Comment: retired  Tobacco Use  . Smoking status: Never Smoker  . Smokeless tobacco: Never Used  Vaping Use  . Vaping Use: Never used  Substance and Sexual Activity  . Alcohol use: No    Alcohol/week: 0.0 standard drinks  . Drug use: No  . Sexual activity: Yes  Other Topics Concern  . Not on file  Social History Narrative  . Not on file   Social Determinants of Health   Financial Resource Strain: Not on file  Food Insecurity: Not on file  Transportation Needs: Not on file  Physical Activity: Not on file  Stress: Not on file  Social Connections: Not on file     Family History: The patient's family history includes Cancer in his mother; Deep vein thrombosis in his sister; Lymphoma in his maternal grandmother.  ROS:   Please see the history of present illness.    ROS  All other systems reviewed and negative.   EKGs/Labs/Other Studies Reviewed:    The following studies were reviewed today: 2D echo IMPRESSIONS   1. Left ventricular ejection fraction, by estimation, is 50 to 55%. The  left ventricle has low normal function. The left ventricle has no regional  wall motion abnormalities. Left ventricular diastolic function could not  be evaluated.  2. Right ventricular systolic function is normal. The right ventricular  size is normal.  3. Left atrial size was moderately dilated.  4. The  mitral valve has been repaired/replaced. No evidence of mitral  valve regurgitation. There is a present in the mitral position.  5. The aortic valve is tricuspid. Aortic valve regurgitation is not  visualized. No aortic stenosis is present.  6. Aortic dilatation noted. There is borderline dilatation of the  ascending aorta measuring 37 mm.  7. The inferior vena cava is normal in size with greater than 50%  respiratory variability, suggesting right atrial pressure of 3 mmHg.   Comparison(s): No significant change from prior study.   EKG:  EKG is  ordered today.  The ekg ordered today demonstrates NSR with 1st degree AVB  Recent Labs: 04/05/2020: ALT 17; BUN 13; Creatinine, Ser 0.99; Hemoglobin 16.4; Platelets 330; Potassium 3.9; Sodium 139   Recent Lipid Panel No results found for: CHOL, TRIG, HDL, CHOLHDL, VLDL, LDLCALC, LDLDIRECT  Physical Exam:    VS:  BP 132/86   Pulse 73   Ht 5' 10.5" (1.791 m)   Wt 187 lb 12.8 oz (85.2 kg)   SpO2 98%   BMI 26.57 kg/m     Wt Readings from Last 3 Encounters:  10/27/20 187 lb 12.8 oz (85.2 kg)  10/16/20 191 lb 3.2 oz (86.7 kg)  04/05/20 200 lb (90.7 kg)     GEN: Well nourished, well developed in no acute distress HEENT: Normal NECK: No JVD; No carotid bruits LYMPHATICS: No lymphadenopathy CARDIAC:RRR, no murmurs, rubs, gallops RESPIRATORY:  Clear to auscultation without rales, wheezing or rhonchi  ABDOMEN: Soft, non-tender, non-distended MUSCULOSKELETAL:  No edema; No deformity  SKIN: Warm and dry NEUROLOGIC:  Alert and oriented x 3 PSYCHIATRIC:  Normal affect       1. Mitral valve prolapse   2. DCM (dilated cardiomyopathy) ([place])   3. Primary hypertension   4. PVC's (premature ventricular contractions)    PLAN:    In order of problems listed above:  1. MVP with MR  -status post mitral valve repair.  -2D echo 11/2019 showed stable mitral valve repair with mean mitral valve gradient 3.5 mmHg and no MR  2. Dilated  cardiomyopathy 2D echo 11/2019 showed EF 50-55%  3. Hypertension -BP controlled on exam today -continue Carvedilol 6.25 BID and Lisinopril 20mg  daily -SCR stable at  0.89 and K+ 4.9 in Nov 2021  4. PVC's  -he has not had any palpitations since I saw him last -continue BB   Medication Adjustments/Labs and Tests Ordered: Current medicines are reviewed at length with the patient today.  Concerns regarding medicines are outlined above.  No orders of the defined types were placed in this encounter.  No orders of the defined types were  placed in this encounter.   Signed, Fransico Him, MD  10/27/2020 10:25 AM    [place]

## 2020-10-27 NOTE — Patient Instructions (Signed)

## 2020-10-30 NOTE — Addendum Note (Signed)
Addended by: Patterson Hammersmith A on: 10/30/2020 01:03 PM   Modules accepted: Orders

## 2021-01-09 ENCOUNTER — Other Ambulatory Visit: Payer: Self-pay | Admitting: Cardiology

## 2021-01-12 NOTE — Telephone Encounter (Signed)
Rx(s) sent to pharmacy electronically.  

## 2021-04-27 ENCOUNTER — Other Ambulatory Visit: Payer: Self-pay | Admitting: Urology

## 2021-04-27 DIAGNOSIS — C61 Malignant neoplasm of prostate: Secondary | ICD-10-CM

## 2021-06-25 ENCOUNTER — Other Ambulatory Visit: Payer: Self-pay

## 2021-06-25 ENCOUNTER — Ambulatory Visit
Admission: RE | Admit: 2021-06-25 | Discharge: 2021-06-25 | Disposition: A | Discharge status: Home / Self Care | Payer: Medicare Other | Source: Ambulatory Visit | Attending: Urology | Admitting: Urology

## 2021-06-25 DIAGNOSIS — C61 Malignant neoplasm of prostate: Secondary | ICD-10-CM

## 2021-06-25 MED ORDER — GADOBENATE DIMEGLUMINE 529 MG/ML IV SOLN
16.0000 mL | Freq: Once | INTRAVENOUS | Status: AC | PRN
  Administered 2021-06-25: 16 mL via INTRAVENOUS

## 2021-08-06 ENCOUNTER — Other Ambulatory Visit: Payer: Self-pay | Admitting: Surgery

## 2021-10-04 ENCOUNTER — Other Ambulatory Visit: Payer: Self-pay | Admitting: Surgery

## 2021-10-05 ENCOUNTER — Telehealth: Payer: Self-pay

## 2021-10-05 ENCOUNTER — Telehealth: Payer: Self-pay | Admitting: *Deleted

## 2021-10-05 NOTE — Telephone Encounter (Addendum)
? ? ?  Name: Jordan Payne  ?DOB: Dec 12, 1955  ?MRN: [id number] ? ?Primary Cardiologist: Fransico Him, MD ? ?Preoperative team, please contact this patient and find out when surgery is scheduled for to help [place] determine need for in-office versus tele visit for preop. ? ?- If after 10/27/21, would suggest going ahead and setting up a 1 year follow-up visit for preop clearance in person since last OV was 10/27/20 ? ?- If surgery planned before then, patient appears eligible for tele visit based on history - can set up a phone call appointment for further preoperative risk assessment. (If this is the case, please obtain consent and complete medication review.) Thank you for your help. ? ?No antiplt/anticoags listed to hold. ? ?Charlie Pitter, [name] ?10/05/2021, 1:41 PM ?[phone] ?[facility] ?[address] ?[place], [place] [address] ? ? ?

## 2021-10-05 NOTE — Telephone Encounter (Signed)
? ?  Pre-operative Risk Assessment  ?  ?Patient Name: Jordan Payne  ?DOB: Feb 09, 1956 ?MRN: [id number]  ? ?  ? ?Request for Surgical Clearance   ? ?Procedure:   LNG HERNIA REPAIR ? ?Date of Surgery:  Clearance TBD                              ?   ?Surgeon:  NONE LISTED ?Surgeon's Group or Practice Name:  [facility] ?Phone number:  [phone] ?Fax number:  [phone], ATTN: JOCELYN BOND ?  ?Type of Clearance Requested:   ?- Medical  ?  ?Type of Anesthesia:  General  ?  ?Additional requests/questions:   ? ?Signed, ?Jacinta Shoe   ?10/05/2021, 10:24 AM   ?

## 2021-10-05 NOTE — Telephone Encounter (Signed)
Pt agreeable to tele pre op appt 10/06/21 @ 11 am. Med rec and consent done.  ?

## 2021-10-05 NOTE — Telephone Encounter (Signed)
Pt agreeable to tele pre op appt 10/06/21 @ 11 am. Med rec and consent done.  ? ?  ?Patient Consent for Virtual Visit  ? ? ?   ? ?Jordan Payne has provided verbal consent on 10/05/2021 for a virtual visit (video or telephone). ? ? ?CONSENT FOR VIRTUAL VISIT FOR:  Jordan Payne  ?By participating in this virtual visit I agree to the following: ? ?I hereby voluntarily request, consent and authorize [facility] and its employed or contracted physicians, physician assistants, nurse practitioners or other licensed health care professionals (the Practitioner), to provide me with telemedicine health care services (the ?Services") as deemed necessary by the treating Practitioner. I acknowledge and consent to receive the Services by the Practitioner via telemedicine. I understand that the telemedicine visit will involve communicating with the Practitioner through live audiovisual communication technology and the disclosure of certain medical information by electronic transmission. I acknowledge that I have been given the opportunity to request an in-person assessment or other available alternative prior to the telemedicine visit and am voluntarily participating in the telemedicine visit. ? ?I understand that I have the right to withhold or withdraw my consent to the use of telemedicine in the course of my care at any time, without affecting my right to future care or treatment, and that the Practitioner or I may terminate the telemedicine visit at any time. I understand that I have the right to inspect all information obtained and/or recorded in the course of the telemedicine visit and may receive copies of available information for a reasonable fee.  I understand that some of the potential risks of receiving the Services via telemedicine include:  ?Delay or interruption in medical evaluation due to technological equipment failure or disruption; ?Information transmitted may not be sufficient (e.g. poor resolution of  images) to allow for appropriate medical decision making by the Practitioner; and/or  ?In rare instances, security protocols could fail, causing a breach of personal health information. ? ?Furthermore, I acknowledge that it is my responsibility to provide information about my medical history, conditions and care that is complete and accurate to the best of my ability. I acknowledge that Practitioner's advice, recommendations, and/or decision may be based on factors not within their control, such as incomplete or inaccurate data provided by me or distortions of diagnostic images or specimens that may result from electronic transmissions. I understand that the practice of medicine is not an exact science and that Practitioner makes no warranties or guarantees regarding treatment outcomes. I acknowledge that a copy of this consent can be made available to me via my patient portal ([facility]), or I can request a printed copy by calling the office of [facility].   ? ?I understand that my insurance will be billed for this visit.  ? ?I have read or had this consent read to me. ?I understand the contents of this consent, which adequately explains the benefits and risks of the Services being provided via telemedicine.  ?I have been provided ample opportunity to ask questions regarding this consent and the Services and have had my questions answered to my satisfaction. ?I give my informed consent for the services to be provided through the use of telemedicine in my medical care ? ? ? ?

## 2021-10-05 NOTE — Telephone Encounter (Signed)
I s/w Jordan Payne, today and confirmed procedure has not been scheduled yet, also I did confirm the procedure.  ? ?CORRECT PROCEDURE IS: INGUINAL HERNIA REPAIR.. I explained the pt will need an appt.  ? ?Will call the pt for an appt.  ?

## 2021-10-06 ENCOUNTER — Other Ambulatory Visit: Payer: Self-pay

## 2021-10-06 ENCOUNTER — Ambulatory Visit (INDEPENDENT_AMBULATORY_CARE_PROVIDER_SITE_OTHER): Payer: Medicare Other | Admitting: Physician Assistant

## 2021-10-06 DIAGNOSIS — Z01810 Encounter for preprocedural cardiovascular examination: Secondary | ICD-10-CM

## 2021-10-06 NOTE — Progress Notes (Signed)
? ?Virtual Visit via Telephone Note  ? ?This visit type was conducted due to national recommendations for restrictions regarding the COVID-19 Pandemic (e.g. social distancing) in an effort to limit this patient's exposure and mitigate transmission in our community.  Due to his co-morbid illnesses, this patient is at least at moderate risk for complications without adequate follow up.  This format is felt to be most appropriate for this patient at this time.  The patient did not have access to video technology/had technical difficulties with video requiring transitioning to audio format only (telephone).  All issues noted in this document were discussed and addressed.  No physical exam could be performed with this format.  Please refer to the patient's chart for his  consent to telehealth for [facility] ? ?Evaluation Performed:  Preoperative cardiovascular risk assessment ?_____________  ? ?Date:  10/06/2021  ? ?Patient ID:  Jordan Payne, DOB 1956-05-02, MRN [id number] ?Patient Location:  ?Home ?Provider location:   ?Office ? ?Primary Care Provider:  Lawerance Cruel, MD ?Primary Cardiologist:  Fransico Him, MD ? ?Chief Complaint  ?  ?66 y.o. y/o male with a h/o MVP s/p MV repair, hypertension, and history of dilated cardiomyopathy with normalized EF, who is pending inguinal hernia repair, and presents today for telephonic preoperative cardiovascular risk assessment. ? ?Past Medical History  ?  ?Past Medical History:  ?Diagnosis Date  ? Arthritis   ? DCM (dilated cardiomyopathy) ([place])   ? EF 40-45% by echo 2015.  EF 55% by echo 2017. EF 50-55% by echo 2021  ? Hypertension   ? MVP (mitral valve prolapse)   ? s/p repair  ? Prostate cancer ([place])   ? PVC's (premature ventricular contractions)   ? Tibialis posterior rupture, right, initial encounter   ? right foot  ? ?Past Surgical History:  ?Procedure Laterality Date  ? APPENDECTOMY    ? PROSTATE BIOPSY    ? robotic mitral valve repair    ? TENDON TRANSPLANT    ?  PT tendon transplant and calcaneal osteotomy  ? TONSILLECTOMY    ? ? ?Allergies ? ?No Known Allergies ? ?History of Present Illness  ?  ?Jordan Payne is a 66 y.o. male who presents via audio/video conferencing for a telehealth visit today.  Pt was last seen in cardiology clinic on 10/27/2020 by Dr. Alfonzo Feller.  At that time Jordan Payne was doing well.  The patient is now pending inguinal hernia repair.  Since his last visit, he has been doing well and walk roughly 3 miles per day without exertional chest pain or worsening dyspnea. ? ? ?Home Medications  ?  ?Prior to Admission medications   ?Medication Sig Start Date End Date Taking? Authorizing Provider  ?Ascorbic Acid (VITAMIN C) 1000 MG tablet Take 1,000 mg by mouth daily.    [provider]  ?Calcium Carbonate+Vitamin D 600-200 MG-UNIT TABS 1 tablet with a meal    [provider]  ?carvedilol (COREG) 6.25 MG tablet TAKE 1 TABLET TWICE DAILY  WITH MEALS 01/12/21   Sueanne Margarita, MD  ?cetirizine (ZYRTEC) 10 MG tablet Take 10 mg by mouth daily.    [provider]  ?Cholecalciferol (VITAMIN D3) 1.25 MG (50000 UT) CAPS Take by mouth.    [provider]  ?Coenzyme Q10 (COQ10) 100 MG CAPS Take 100 mg by mouth daily.    [provider]  ?fluticasone (FLONASE) 50 MCG/ACT nasal spray Place 1 spray into both nostrils daily as needed.    [provider]  ?Glucos-Chond-Hyal Ac-Ca Fructo (MOVE FREE JOINT HEALTH ADVANCE) TABS See admin instructions.    [provider]  ?lisinopril (PRINIVIL,ZESTRIL) 20 MG tablet Take 1 tablet (20 mg total) by mouth daily. 07/29/14   Sueanne Margarita, MD  ?Magnesium 400 MG CAPS Take 400 mg by mouth daily.    [provider]  ?Multiple Vitamins-Minerals (MULTIVITAMIN & MINERAL PO) Take 1 tablet by mouth daily.    [provider]  ?Olopatadine HCl 0.2 % SOLN Place 1 drop into both eyes daily as needed.    [provider]  ?rosuvastatin (CRESTOR) 10 MG  tablet Take 10 mg by mouth daily. 09/26/17   [provider]  ?TURMERIC PO Take 1,000 mg by mouth daily. (Liquid)    [provider]  ? ? ?Physical Exam  ?  ?Vital Signs:  Jordan Payne does not have vital signs available for review today. ? ?Given telephonic nature of communication, physical exam is limited. ?AAOx3. NAD. Normal affect.  Speech and respirations are unlabored. ? ?Accessory Clinical Findings  ?  ?None ? ?Assessment & Plan  ?  ?1.  Preoperative Cardiovascular Risk Assessment: ? -Patient has upcoming hernia repair.  He has been able to walk 3 miles per day without any exertional chest pain or worsening dyspnea.  He has a history of mitral valve repair.  Last echocardiogram in 2021 suggested normal ejection fraction.  Given the good functional ability which is clearly greater than 4 METS of activity level, he is cleared to proceed with inguinal hernia repair without further work-up. ? ? ?A copy of this note will be routed to requesting surgeon. ? ?Time:   ?Today, I have spent 12 minutes with the patient with telehealth technology discussing medical history, symptoms, and management plan.   ? ? ?Almyra Deforest, [place] ? ?10/06/2021, 11:34 AM ? ?

## 2022-01-04 ENCOUNTER — Other Ambulatory Visit: Payer: Self-pay | Admitting: Cardiology

## 2022-01-27 ENCOUNTER — Other Ambulatory Visit: Payer: Self-pay | Admitting: Cardiology

## 2022-02-21 ENCOUNTER — Telehealth: Payer: Self-pay | Admitting: Cardiology

## 2022-02-21 NOTE — Progress Notes (Unsigned)
Cardiology Office Note:    Date:  02/22/2022   ID:  Jordan Payne, DOB 1955/12/20, MRN [id number]  PCP:  Lawerance Cruel, MD   [place] Providers Cardiologist:  Fransico Him, MD     Referring MD: Lawerance Cruel, MD   Chief Complaint: palpitations  History of Present Illness:    Jordan Payne is a very pleasant 66 y.o. male with a hx of MVP s/p MV repair, HTN, dilated cardiomyopathy with normalized EF, and frequent PVCs.    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 He was last seen in our office on 10/27/2020 by Dr. Radford Pax at which time one year follow-up was recommended. Cleared for hernia repair on 10/06/21 by Almyra Deforest, PA.   Today, he is here alone for evaluation of palpitations, elevated heart rate, and saw abnormal ekg on BP monitor x 1 week.  He has been doing a lot of reading and asks appropriate questions about recent viral illness April 2023 for which he took steroids. Took him 2 1/2 weeks to recover. Had hernia surgery 11/29/21, no problems. Had pneumonia vaccine 02/18/22 wonders if these things exacerbated a fib. Also read to limit alcohol and admits that he occasionally has 4-5 drinks on one occasion. Volunteers as DJ on weekends.  Had stopped walking prior to viral illness but was previously walking 3 miles daily. Has resumed this activity this week. Notes more SOB with exertion. Previously cycled strenuously prior to MV surgery 2003. Has an upcoming facet ablation for arthritis. Monitors snoring with an app, negative sleep apnea many years ago. Monitors home HR and BP. BP on occasion has been low - 95/65. HR has been running > 90 bpm frequently. He denies lightheadedness, presyncope, syncope.   Past Medical History:  Diagnosis Date   Arthritis    DCM (dilated cardiomyopathy) ([place])    EF  40-45% by echo 2015.  EF 55% by echo 2017. EF 50-55% by echo 2021   Hypertension    MVP (mitral valve prolapse)    s/p repair   Prostate cancer ([place])    PVC's (premature ventricular contractions)    Tibialis posterior rupture, right, initial encounter    right foot    Past Surgical History:  Procedure Laterality Date   APPENDECTOMY     PROSTATE BIOPSY     robotic mitral valve repair     TENDON TRANSPLANT     PT tendon transplant and calcaneal osteotomy   TONSILLECTOMY      Current Medications: Current Meds  Medication Sig   apixaban (ELIQUIS) 5 MG TABS tablet Take 1 tablet (5 mg total) by mouth 2 (two) times daily.   Ascorbic Acid (VITAMIN C) 1000 MG tablet Take 1,000 mg by mouth daily.   Calcium Carbonate+Vitamin D 600-200 MG-UNIT TABS Take 1 tablet by mouth 2 (two) times daily.   carvedilol (COREG) 6.25 MG tablet TAKE 1 TABLET TWICE DAILY  WITH MEALS   cetirizine (ZYRTEC) 10 MG tablet Take 10 mg by mouth daily.   Cholecalciferol (VITAMIN D3) 1.25 MG (50000 UT) CAPS Take by mouth.   Coenzyme Q10 (COQ10) 100 MG CAPS Take 100 mg by mouth daily.   Glucos-Chond-Hyal Ac-Ca Fructo (MOVE FREE JOINT HEALTH ADVANCE) TABS See admin instructions.   lisinopril (PRINIVIL,ZESTRIL) 20 MG tablet Take 1 tablet (20 mg total) by mouth daily.   Magnesium  400 MG CAPS Take 400 mg by mouth daily.   Multiple Vitamins-Minerals (MULTIVITAMIN & MINERAL PO) Take 1 tablet by mouth daily.   rosuvastatin (CRESTOR) 10 MG tablet Take 10 mg by mouth daily.   TURMERIC PO Take 1,000 mg by mouth daily. (Liquid)     Allergies:   Patient has no known allergies.   Social History   Socioeconomic History   Marital status: Single    Spouse name: Not on file   Number of children: 0   Years of education: Not on file   Highest education level: Not on file  Occupational History   Occupation: Chief Financial Officer    Comment: retired  Tobacco Use   Smoking status: Never   Smokeless tobacco: Never  Vaping Use   Vaping  Use: Never used  Substance and Sexual Activity   Alcohol use: No    Alcohol/week: 0.0 standard drinks of alcohol   Drug use: No   Sexual activity: Yes  Other Topics Concern   Not on file  Social History Narrative   Not on file   Social Determinants of Health   Financial Resource Strain: Not on file  Food Insecurity: Not on file  Transportation Needs: Not on file  Physical Activity: Not on file  Stress: Not on file  Social Connections: Not on file     Family History: The patient's family history includes Cancer in his mother; Deep vein thrombosis in his sister; Lymphoma in his maternal grandmother.  ROS:   Please see the history of present illness.    + DOE + palpitations All other systems reviewed and are negative.  Labs/Other Studies Reviewed:    The following studies were reviewed today:  Echo 11/13/19  1. Left ventricular ejection fraction, by estimation, is 50 to 55%. The  left ventricle has low normal function. The left ventricle has no regional  wall motion abnormalities. Left ventricular diastolic function could not  be evaluated.   2. Right ventricular systolic function is normal. The right ventricular  size is normal.   3. Left atrial size was moderately dilated.   4. The mitral valve has been repaired/replaced. No evidence of mitral  valve regurgitation. There is a present in the mitral position.   5. The aortic valve is tricuspid. Aortic valve regurgitation is not  visualized. No aortic stenosis is present.   6. Aortic dilatation noted. There is borderline dilatation of the  ascending aorta measuring 37 mm.   7. The inferior vena cava is normal in size with greater than 50%  respiratory variability, suggesting right atrial pressure of 3 mmHg.   Comparison(s): No significant change from prior study.    Recent Labs: No results found for requested labs within last 365 days.  Recent Lipid Panel No results found for: "CHOL", "TRIG", "HDL", "CHOLHDL",  "VLDL", "LDLCALC", "LDLDIRECT"   Risk Assessment/Calculations:    CHA2DS2-VASc Score = 2  This indicates a 2.2% annual risk of stroke. The patient's score is based upon: CHF History: 0 HTN History: 1 Diabetes History: 0 Stroke History: 0 Vascular Disease History: 0 Age Score: 1 Gender Score: 0    Physical Exam:    VS:  BP 132/88   Pulse (!) 103   Ht 5' 10.5" (1.791 m)   Wt 184 lb 12.8 oz (83.8 kg)   SpO2 97%   BMI 26.14 kg/m     Wt Readings from Last 3 Encounters:  02/22/22 184 lb 12.8 oz (83.8 kg)  10/27/20 187 lb 12.8 oz (85.2  kg)  10/16/20 191 lb 3.2 oz (86.7 kg)     GEN:  Well nourished, well developed in no acute distress HEENT: Normal NECK: No JVD; No carotid bruits CARDIAC: Irregular RR, no murmurs, rubs, gallops RESPIRATORY:  Clear to auscultation without rales, wheezing or rhonchi  ABDOMEN: Soft, non-tender, non-distended MUSCULOSKELETAL:  No edema; No deformity. 2+ pedal pulses, equal bilaterally SKIN: Warm and dry NEUROLOGIC:  Alert and oriented x 3 PSYCHIATRIC:  Normal affect   EKG:  EKG is ordered today.  The ekg ordered today demonstrates atrial fibrillation with RVR at 103 bpm  Diagnoses:    1. DCM (dilated cardiomyopathy) ([place])   2. Mitral valve prolapse   3. New onset a-fib ([place])   4. Essential hypertension   5. H/O mitral valve repair    Assessment and Plan:     New onset atrial fibrillation: EKG today reveals A-fib with RVR at 103 bpm.  Recent palpitations, dyspnea on exertion, and irregular HR noted on monitor.  Felt different than previous PVCs.  We discussed various treatment options including antiarrhythmic medications, a fib clinic, DCCV, and continuing beta-blocker.  He is agreeable to start Eliquis 5 mg twice daily.  He would like to continue carvedilol 6.25 mg twice daily.  I have given him permission to take an extra 6.25 mg (for total of 12.5 mg) if HR greater than 100 bpm and SBP greater than 100 mmHg.  Advised him to notify us if  HR remains elevated.  He will return in 3-4 weeks for follow-up visit at which time we will plan for cardioversion if he is still in atrial fibrillation.  Dilated cardiomyopathy: LVEF 50-55%, unable to evaluate diastolic parameters on echo 11/2019. Having more dyspnea on exertion which may be 2/2 new onset A-fib.  He denies edema, orthopnea, PND.  Appears euvolemic on exam.  Weight is stable.  Will plan to recheck echo in a few months, preferably in sinus rhythm.  MVP s/p repair: MV repair 2003.Stable mitral valve function on echo 11/2019.  Palpitations and DOE felt to be 2/2 new onset A-fib as they are recent in onset.  As noted above, we will plan for repeat echo soon, preferably when he is NSR.  Hypertension:  BP is well-controlled. Occasional soft BPs at home. Will continue carvedilol at 6.25 mg twice daily with advisement to increase to 12.5 mg if HR greater than 100.  Advised him to continue to monitor and report if SBP < 100 mmHg.   Disposition: 3-4 weeks with APP   Medication Adjustments/Labs and Tests Ordered: Current medicines are reviewed at length with the patient today.  Concerns regarding medicines are outlined above.  Orders Placed This Encounter  Procedures   TSH   Magnesium   Comp Met (CMET)   EKG 12-Lead   Meds ordered this encounter  Medications   apixaban (ELIQUIS) 5 MG TABS tablet    Sig: Take 1 tablet (5 mg total) by mouth 2 (two) times daily.    Dispense:  60 tablet    Refill:  11    Patient Instructions  Medication Instructions:   START Eliquis one (1) tablet by mouth ( 5 mg ) twice daily.   *If you need a refill on your cardiac medications before your next appointment, please call your pharmacy*   Lab Work:  TODAY!! MAG/TSH/CMET  If you have labs (blood work) drawn today and your tests are completely normal, you will receive your results only by: [place] (if you have MyChart) OR  A paper copy in the mail If you have any lab test that is  abnormal or we need to change your treatment, we will call you to review the results.   Testing/Procedures:  None ordered.   Follow-Up: At [facility], you and your health needs are our priority.  As part of our continuing mission to provide you with exceptional heart care, we have created designated Provider Care Teams.  These Care Teams include your primary Cardiologist (physician) and Advanced Practice Providers (APPs -  Physician Assistants and Nurse Practitioners) who all work together to provide you with the care you need, when you need it.  We recommend signing up for the patient portal called "MyChart".  Sign up information is provided on this After Visit Summary.  MyChart is used to connect with patients for Virtual Visits (Telemedicine).  Patients are able to view lab/test results, encounter notes, upcoming appointments, etc.  Non-urgent messages can be sent to your provider as well.   To learn more about what you can do with MyChart, go to [email or web address].    Your next appointment:   4 week(s)  The format for your next appointment:   In Person  Provider:   Christen Bame, NP        rmation About Sugar         Signed, Emmaline Life, NP  02/22/2022 12:07 PM    [place]

## 2022-02-21 NOTE — Telephone Encounter (Signed)
Spoke with the patient who reports that over the past week or so that he has been having palpitations. He states that his heart rate has also been higher than normal. Normally runs 60-80 but has been in the 90s at times. He states that he can feel that his heart is beating irregularly. He states that he does drink alcohol but has not had any since he has noticed the palpitations and they have continued. He reports that he recently had not been walking also much as he used to for the past several months. States that about a week ago he started getting back into his daily walks. He takes them early in the morning and makes sure to stay hydrated throughout the day. He states that he has noticed he becomes more fatigued and doesn't feel quite right. I have scheduled the patient to come in tomorrow and see Christen Bame, NP for an EKG and evaluation.

## 2022-02-21 NOTE — Telephone Encounter (Signed)
   Patient c/o Palpitations:  High priority if patient c/o lightheadedness, shortness of breath, or chest pain  How long have you had palpitations/irregular HR/ Afib? Are you having the symptoms now? A week  Are you currently experiencing lightheadedness, SOB or CP? No   Do you have a history of afib (atrial fibrillation) or irregular heart rhythm? No   Have you checked your BP or HR? (document readings if available):  Hr is higher than normal he states.  132/81 hr 84   Are you experiencing any other symptoms? Pt states he has been experiencing afib. He states after he eats, it is more of an issue. He states he does have fatigue on and off. He states he has been using a machine

## 2022-02-22 ENCOUNTER — Ambulatory Visit (INDEPENDENT_AMBULATORY_CARE_PROVIDER_SITE_OTHER): Payer: Medicare Other | Admitting: Nurse Practitioner

## 2022-02-22 ENCOUNTER — Encounter: Payer: Self-pay | Admitting: Nurse Practitioner

## 2022-02-22 VITALS — BP 132/88 | HR 103 | Ht 70.5 in | Wt 184.8 lb

## 2022-02-22 DIAGNOSIS — I4891 Unspecified atrial fibrillation: Secondary | ICD-10-CM | POA: Diagnosis not present

## 2022-02-22 DIAGNOSIS — I341 Nonrheumatic mitral (valve) prolapse: Secondary | ICD-10-CM

## 2022-02-22 DIAGNOSIS — I420 Dilated cardiomyopathy: Secondary | ICD-10-CM

## 2022-02-22 DIAGNOSIS — I10 Essential (primary) hypertension: Secondary | ICD-10-CM | POA: Diagnosis not present

## 2022-02-22 DIAGNOSIS — Z9889 Other specified postprocedural states: Secondary | ICD-10-CM

## 2022-02-22 LAB — COMPREHENSIVE METABOLIC PANEL
ALT: 11 IU/L (ref 0–44)
AST: 18 IU/L (ref 0–40)
Albumin/Globulin Ratio: 2 (ref 1.2–2.2)
Albumin: 4.5 g/dL (ref 3.9–4.9)
Alkaline Phosphatase: 64 IU/L (ref 44–121)
BUN/Creatinine Ratio: 16 (ref 10–24)
BUN: 14 mg/dL (ref 8–27)
Bilirubin Total: 0.7 mg/dL (ref 0.0–1.2)
CO2: 22 mmol/L (ref 20–29)
Calcium: 10 mg/dL (ref 8.6–10.2)
Chloride: 102 mmol/L (ref 96–106)
Creatinine, Ser: 0.86 mg/dL (ref 0.76–1.27)
Globulin, Total: 2.3 g/dL (ref 1.5–4.5)
Glucose: 100 mg/dL — ABNORMAL HIGH (ref 70–99)
Potassium: 4.8 mmol/L (ref 3.5–5.2)
Sodium: 138 mmol/L (ref 134–144)
Total Protein: 6.8 g/dL (ref 6.0–8.5)
eGFR: 96 mL/min/{1.73_m2} (ref >59)

## 2022-02-22 LAB — TSH: TSH: 2.5 u[IU]/mL (ref 0.450–4.500)

## 2022-02-22 LAB — MAGNESIUM: Magnesium: 2.4 mg/dL — ABNORMAL HIGH (ref 1.6–2.3)

## 2022-02-22 MED ORDER — APIXABAN 5 MG PO TABS: 5.0000 mg | ORAL_TABLET | Freq: Two times a day (BID) | ORAL | 11 refills | Status: DC

## 2022-02-22 NOTE — Patient Instructions (Signed)
Medication Instructions:   START Eliquis one (1) tablet by mouth ( 5 mg ) twice daily.   *If you need a refill on your cardiac medications before your next appointment, please call your pharmacy*   Lab Work:  TODAY!! MAG/TSH/CMET  If you have labs (blood work) drawn today and your tests are completely normal, you will receive your results only by: [place] (if you have MyChart) OR A paper copy in the mail If you have any lab test that is abnormal or we need to change your treatment, we will call you to review the results.   Testing/Procedures:  None ordered.   Follow-Up: At [facility], you and your health needs are our priority.  As part of our continuing mission to provide you with exceptional heart care, we have created designated Provider Care Teams.  These Care Teams include your primary Cardiologist (physician) and Advanced Practice Providers (APPs -  Physician Assistants and Nurse Practitioners) who all work together to provide you with the care you need, when you need it.  We recommend signing up for the patient portal called "MyChart".  Sign up information is provided on this After Visit Summary.  MyChart is used to connect with patients for Virtual Visits (Telemedicine).  Patients are able to view lab/test results, encounter notes, upcoming appointments, etc.  Non-urgent messages can be sent to your provider as well.   To learn more about what you can do with MyChart, go to [email or web address].    Your next appointment:   4 week(s)  The format for your next appointment:   In Person  Provider:   Christen Bame, NP        [employer]

## 2022-02-23 NOTE — Progress Notes (Signed)
Pt has been made aware of normal result and verbalized understanding.  jw

## 2022-03-15 NOTE — Progress Notes (Addendum)
Cardiology Office Note:    Date:  03/21/2022   ID:  Jordan Payne, DOB August 09, 1955, MRN [id number]  PCP:  Lawerance Cruel, MD   [facility] Cardiologist:  Fransico Him, MD     Referring MD: Lawerance Cruel, MD   Chief Complaint: palpitations  History of Present Illness:    Jordan Payne is a very pleasant 66 y.o. male with a hx of MVP s/p MV repair, HTN, dilated cardiomyopathy with normalized EF, and frequent PVCs.    History of MVP s/p MV repair 2003 with reported normal coronary arteries at the time. LVEF 40-45% on echo 2015, improved to 50-55% on echo 2017, 2021.   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 He was last seen in our office on 10/27/2020 by Dr. Radford Pax at which time one year follow-up was recommended. Cleared for hernia repair on 10/06/21 by Almyra Deforest, PA.   Seen by me on 02/22/22 for evaluation of palpitations, elevated heart rate, and saw abnormal ekg on BP monitor x 1 week.  Has been doing a lot of reading and asks appropriate questions about recent viral illness April 2023 for which he took steroids. Took him 2 1/2 weeks to recover. Had hernia surgery 11/29/21, no problems. Had pneumonia vaccine 02/18/22 wonders if these things exacerbated a fib. Also read to limit alcohol and admits that he occasionally has 4-5 drinks on one occasion. Volunteers as DJ on weekends.  Had stopped walking prior to viral illness but was previously walking 3 miles daily. Has resumed this activity this week. Notes more SOB with exertion. Previously cycled strenuously prior to MV surgery 2003. Has an upcoming facet ablation for arthritis. Monitors snoring with an app, negative sleep apnea many years ago. Monitors home HR and BP. BP on occasion has been low - 95/65. HR has been running > 90 bpm frequently. He  denies lightheadedness, presyncope, syncope.   Today, he is here alone for follow-up. Reports some vague symptoms he wonders whether are associated with atrial fibrillation or with side effects of medications. Feels some lightheadedness about 1 hour after eating. Often is fatigued when he wakes up and fatigued in the afternoon, but this seems to be getting better. Walks 1.8 to 5.3 miles daily and has to start out from his home on an incline. Feels mild chest tightness when starting up the hill but is able to complete his walk without any concerning symptoms. "Feeling of chaos" in chest improving. Did an experiment of zero alcohol and caffeine for 2 weeks  with no improvement in symptoms. He denies shortness of breath, lower extremity edema, palpitations, melena, hematuria, diaphoresis, weakness, presyncope, syncope, orthopnea, and PND.  Past Medical History:  Diagnosis Date   Arthritis    DCM (dilated cardiomyopathy) ([place])    EF 40-45% by echo 2015.  EF 55% by echo 2017. EF 50-55% by echo 2021   Hypertension    MVP (mitral valve prolapse)    s/p repair   Prostate cancer ([place])    PVC's (premature ventricular contractions)    Tibialis posterior rupture, right, initial encounter    right foot    Past Surgical History:  Procedure Laterality Date   APPENDECTOMY     PROSTATE BIOPSY     robotic mitral valve repair     TENDON TRANSPLANT     PT tendon  transplant and calcaneal osteotomy   TONSILLECTOMY      Current Medications: Current Meds  Medication Sig   apixaban (ELIQUIS) 5 MG TABS tablet Take 1 tablet (5 mg total) by mouth 2 (two) times daily.   Ascorbic Acid (VITAMIN C) 1000 MG tablet Take 1,000 mg by mouth daily.   Calcium Carbonate+Vitamin D 600-200 MG-UNIT TABS Take 1 tablet by mouth 2 (two) times daily.   cetirizine (ZYRTEC) 10 MG tablet Take 10 mg by mouth daily.   Cholecalciferol (VITAMIN D3) 1.25 MG (50000 UT) CAPS Take by mouth.   Coenzyme Q10 (COQ10) 100 MG CAPS Take 100 mg  by mouth daily.   Glucos-Chond-Hyal Ac-Ca Fructo (MOVE FREE JOINT HEALTH ADVANCE) TABS See admin instructions.   Magnesium 400 MG CAPS Take 400 mg by mouth daily.   Multiple Vitamins-Minerals (MULTIVITAMIN & MINERAL PO) Take 1 tablet by mouth daily.   rosuvastatin (CRESTOR) 10 MG tablet Take 10 mg by mouth daily.   TURMERIC PO Take 1,000 mg by mouth daily. (Liquid)   [DISCONTINUED] carvedilol (COREG) 6.25 MG tablet TAKE 1 TABLET TWICE DAILY  WITH MEALS   [DISCONTINUED] lisinopril (PRINIVIL,ZESTRIL) 20 MG tablet Take 1 tablet (20 mg total) by mouth daily.     Allergies:   Patient has no known allergies.   Social History   Socioeconomic History   Marital status: Single    Spouse name: Not on file   Number of children: 0   Years of education: Not on file   Highest education level: Not on file  Occupational History   Occupation: Chief Financial Officer    Comment: retired  Tobacco Use   Smoking status: Never   Smokeless tobacco: Never  Vaping Use   Vaping Use: Never used  Substance and Sexual Activity   Alcohol use: No    Alcohol/week: 0.0 standard drinks of alcohol   Drug use: No   Sexual activity: Yes  Other Topics Concern   Not on file  Social History Narrative   Not on file   Social Determinants of Health   Financial Resource Strain: Not on file  Food Insecurity: Not on file  Transportation Needs: Not on file  Physical Activity: Not on file  Stress: Not on file  Social Connections: Not on file     Family History: The patient's family history includes Cancer in his mother; Deep vein thrombosis in his sister; Lymphoma in his maternal grandmother.  ROS:   Please see the history of present illness.    + chest tightness All other systems reviewed and are negative.  Labs/Other Studies Reviewed:    The following studies were reviewed today:  Echo 11/13/19  1. Left ventricular ejection fraction, by estimation, is 50 to 55%. The  left ventricle has low normal function. The left  ventricle has no regional  wall motion abnormalities. Left ventricular diastolic function could not  be evaluated.   2. Right ventricular systolic function is normal. The right ventricular  size is normal.   3. Left atrial size was moderately dilated.   4. The mitral valve has been repaired/replaced. No evidence of mitral  valve regurgitation. There is a present in the mitral position.   5. The aortic valve is tricuspid. Aortic valve regurgitation is not  visualized. No aortic stenosis is present.   6. Aortic dilatation noted. There is borderline dilatation of the  ascending aorta measuring 37 mm.   7. The inferior vena cava is normal in size with greater than 50%  respiratory variability, suggesting  right atrial pressure of 3 mmHg.   Comparison(s): No significant change from prior study.    Recent Labs: 02/22/2022: ALT 11; BUN 14; Creatinine, Ser 0.86; Magnesium 2.4; Potassium 4.8; Sodium 138; TSH 2.500  Recent Lipid Panel No results found for: "CHOL", "TRIG", "HDL", "CHOLHDL", "VLDL", "LDLCALC", "LDLDIRECT"   Risk Assessment/Calculations:    CHA2DS2-VASc Score = 2  This indicates a 2.2% annual risk of stroke. The patient's score is based upon: CHF History: 0 HTN History: 1 Diabetes History: 0 Stroke History: 0 Vascular Disease History: 0 Age Score: 1 Gender Score: 0    Physical Exam:    VS:  BP 120/84   Pulse 80   Ht 5' 10.5" (1.791 m)   Wt 184 lb (83.5 kg)   BMI 26.03 kg/m     Wt Readings from Last 3 Encounters:  03/21/22 184 lb (83.5 kg)  02/22/22 184 lb 12.8 oz (83.8 kg)  10/27/20 187 lb 12.8 oz (85.2 kg)     GEN:  Well nourished, well developed in no acute distress HEENT: Normal NECK: No JVD; No carotid bruits CARDIAC: Irregular RR, no murmurs, rubs, gallops RESPIRATORY:  Clear to auscultation without rales, wheezing or rhonchi  ABDOMEN: Soft, non-tender, non-distended MUSCULOSKELETAL:  No edema; No deformity. 2+ pedal pulses, equal bilaterally SKIN:  Warm and dry NEUROLOGIC:  Alert and oriented x 3 PSYCHIATRIC:  Normal affect   EKG:  EKG is ordered today.  The ekg ordered today demonstrates atrial fibrillation at 80 bpm with occasional premature aberrantly conducted complexes  Diagnoses:    1. Persistent atrial fibrillation ([place])   2. DCM (dilated cardiomyopathy) ([place])   3. New onset a-fib ([place])   4. H/O mitral valve repair   5. PVC's (premature ventricular contractions)   6. Precordial pain   7. Mitral valve prolapse   8. Secondary hypercoagulable state ([place])   9. Essential hypertension    Assessment and Plan:     Persistent atrial fibrillation on chronic anticoagulation: EKG today reveals A-fib with well-controlled HR at 80 bpm.  No concerns for RVR. No palpitations. Continues to have "feeling of chaos in chest." Also describes some chest tightness. Continues to walk for exercise without significant symptoms. No missed doses of Eliquis, no bleeding concerns.  He requested that we increase his carvedilol and decrease his lisinopril in hopes that he may convert to NSR.  Risks of cardioversion were discussed and he would like to proceed. Will plan for follow-up 4-6 weeks after DCCV. He understands to notify us if any missed doses of [place]. Additionally, will refer to EP for consideration of ablation. Continue rate control with carvedilol.   Chest tightness: We discussed his symptoms of chest tightness that occur on inclines and at times with exertion.  Symptoms coincide with onset of atrial fibrillation.  We discussed further evaluation of ischemia after cardioversion if symptoms persist. Would favor coronary CTA if symptoms persist.   Dilated cardiomyopathy: LVEF 50-55%, unable to evaluate diastolic parameters on echo 11/2019. Occasional chest tightness on inclines, continues to walk at least 1.8 miles on most days without significant dyspnea or chest pain. Denies edema, orthopnea, PND.  Appears euvolemic on exam.  Weight is stable. Will  plan to repeat echo when sinus rhythm restored unless clinically indicated sooner.   MVP s/p repair: MV repair 2003.Stable mitral valve function on echo 11/2019.Chest tightness felt to be 2/2 a fib. As noted above, we will plan for repeat echo soon, preferably when he is NSR.   Hypertension:  BP  is well-controlled. Monitors closely at home and provides documentation of well-controlled BP and HR. We discussed current antihypertensive regimen and he would like to reduce dose of lisinopril and increase carvedilol to 12.5 mg twice daily in hopes that he may convert to NSR.  Advised him to continue to monitor BP and HR and to report any concern with this change.   Disposition: 4-6 weeks with Dr. Radford Pax or me  Shared Decision Making/Informed Consent The risks (stroke, cardiac arrhythmias rarely resulting in the need for a temporary or permanent pacemaker, skin irritation or burns and complications associated with conscious sedation including aspiration, arrhythmia, respiratory failure and death), benefits (restoration of normal sinus rhythm) and alternatives of a direct current cardioversion were explained in detail to Mr. Giangrande and he agrees to proceed.    Medication Adjustments/Labs and Tests Ordered: Current medicines are reviewed at length with the patient today.  Concerns regarding medicines are outlined above.  Orders Placed This Encounter  Procedures   Basic Metabolic Panel (BMET)   CBC   Ambulatory referral to Cardiac Electrophysiology   EKG 12-Lead   Meds ordered this encounter  Medications   carvedilol (COREG) 12.5 MG tablet    Sig: Take 1 tablet (12.5 mg total) by mouth 2 (two) times daily with a meal.    Dispense:  60 tablet    Refill:  1   lisinopril (ZESTRIL) 10 MG tablet    Sig: Take 1 tablet (10 mg total) by mouth daily.    Dispense:  30 tablet    Refill:  1    Patient Instructions  Medication Instructions:   INCREASE Coreg one (1) tablet by mouth (12.5 mg) twice daily.    DECREASE Lisinopril one (1) tablet by mouth (10 mg) daily.   *If you need a refill on your cardiac medications before your next appointment, please call your pharmacy*   Lab Work:   Your physician recommends that you return for lab work on Thursday, September 14. You can come in on the day of your appointment anytime between 7:30-4:30.   If you have labs (blood work) drawn today and your tests are completely normal, you will receive your results only by: [place] (if you have MyChart) OR A paper copy in the mail If you have any lab test that is abnormal or we need to change your treatment, we will call you to review the results.   Testing/Procedures:  Dear Mr. Talerico, You are scheduled for a Cardioversion on Tuesday, September 19  with Dr. Margaretann Loveless.  Please arrive at the [facility] (Main Entrance A) at [facility]: [address] [place], [place] [address] at 11:45  am. (1 hour prior to procedure unless lab work is needed; if lab work is needed arrive 1.5 hours ahead)  DIET: Nothing to eat or drink after midnight except a sip of water with medications (see medication instructions below)  FYI: For your safety, and to allow [place] to monitor your vital signs accurately during the surgery/procedure we request that   if you have artificial nails, gel coating, SNS etc. Please have those removed prior to your surgery/procedure. Not having the nail coverings /polish removed may result in cancellation or delay of your surgery/procedure.   Medication Instructions:   Continue your anticoagulant: Eliquis You will need to continue your anticoagulant after your procedure until you  are told by your  Provider that it is safe to stop   Labs:   Come to the lab at [address] between the hours of 8:00 am and 4:30 pm. You do not have to be fasting. On Thursday September 14.   You must have a responsible person to drive you home and stay in the waiting area during your  procedure. Failure to do so could result in cancellation.  Bring your insurance cards.  *Special Note: Every effort is made to have your procedure done on time. Occasionally there are emergencies that occur at the hospital that may cause delays. Please be patient if a delay does occur.     Follow-Up: At [facility], you and your health needs are our priority.  As part of our continuing mission to provide you with exceptional heart care, we have created designated Provider Care Teams.  These Care Teams include your primary Cardiologist (physician) and Advanced Practice Providers (APPs -  Physician Assistants and Nurse Practitioners) who all work together to provide you with the care you need, when you need it.  We recommend signing up for the patient portal called "MyChart".  Sign up information is provided on this After Visit Summary.  MyChart is used to connect with patients for Virtual Visits (Telemedicine).  Patients are able to view lab/test results, encounter notes, upcoming appointments, etc.  Non-urgent messages can be sent to your provider as well.   To learn more about what you can do with MyChart, go to [email or web address].    Your next appointment:   6 week(s)  The format for your next appointment:   In Person  Provider:   Christen Bame, NP         Other Instructions  You have been referred to EP department for discussion of ablation.    Important Information About Sugar         Signed, Emmaline Life, NP  03/21/2022 4:43 PM    [facility]

## 2022-03-15 NOTE — H&P (View-Only) (Signed)
Cardiology Office Note:    Date:  03/21/2022   ID:  Jordan Payne, DOB 09/23/1955, MRN [id number]  PCP:  Lawerance Cruel, MD   [place] Providers Cardiologist:  Fransico Him, MD     Referring MD: Lawerance Cruel, MD   Chief Complaint: palpitations  History of Present Illness:    Jordan Payne is a very pleasant 66 y.o. male with a hx of MVP s/p MV repair, HTN, dilated cardiomyopathy with normalized EF, and frequent PVCs.    History of MVP s/p MV repair 2003 with reported normal coronary arteries at the time. LVEF 40-45% on echo 2015, improved to 50-55% on echo 2017, 2021.   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 He was last seen in our office on 10/27/2020 by Dr. Radford Pax at which time one year follow-up was recommended. Cleared for hernia repair on 10/06/21 by Almyra Deforest, PA.   Seen by me on 02/22/22 for evaluation of palpitations, elevated heart rate, and saw abnormal ekg on BP monitor x 1 week.  Has been doing a lot of reading and asks appropriate questions about recent viral illness April 2023 for which he took steroids. Took him 2 1/2 weeks to recover. Had hernia surgery 11/29/21, no problems. Had pneumonia vaccine 02/18/22 wonders if these things exacerbated a fib. Also read to limit alcohol and admits that he occasionally has 4-5 drinks on one occasion. Volunteers as DJ on weekends.  Had stopped walking prior to viral illness but was previously walking 3 miles daily. Has resumed this activity this week. Notes more SOB with exertion. Previously cycled strenuously prior to MV surgery 2003. Has an upcoming facet ablation for arthritis. Monitors snoring with an app, negative sleep apnea many years ago. Monitors home HR and BP. BP on occasion has been low - 95/65. HR has been running > 90 bpm frequently. He  denies lightheadedness, presyncope, syncope.   Today, he is here alone for follow-up. Reports some vague symptoms he wonders whether are associated with atrial fibrillation or with side effects of medications. Feels some lightheadedness about 1 hour after eating. Often is fatigued when he wakes up and fatigued in the afternoon, but this seems to be getting better. Walks 1.8 to 5.3 miles daily and has to start out from his home on an incline. Feels mild chest tightness when starting up the hill but is able to complete his walk without any concerning symptoms. "Feeling of chaos" in chest improving. Did an experiment of zero alcohol and caffeine for 2 weeks  with no improvement in symptoms. He denies shortness of breath, lower extremity edema, palpitations, melena, hematuria, diaphoresis, weakness, presyncope, syncope, orthopnea, and PND.  Past Medical History:  Diagnosis Date   Arthritis    DCM (dilated cardiomyopathy) ([place])    EF 40-45% by echo 2015.  EF 55% by echo 2017. EF 50-55% by echo 2021   Hypertension    MVP (mitral valve prolapse)    s/p repair   Prostate cancer ([place])    PVC's (premature ventricular contractions)    Tibialis posterior rupture, right, initial encounter    right foot    Past Surgical History:  Procedure Laterality Date   APPENDECTOMY     PROSTATE BIOPSY     robotic mitral valve repair     TENDON TRANSPLANT     PT tendon  transplant and calcaneal osteotomy   TONSILLECTOMY      Current Medications: Current Meds  Medication Sig   apixaban (ELIQUIS) 5 MG TABS tablet Take 1 tablet (5 mg total) by mouth 2 (two) times daily.   Ascorbic Acid (VITAMIN C) 1000 MG tablet Take 1,000 mg by mouth daily.   Calcium Carbonate+Vitamin D 600-200 MG-UNIT TABS Take 1 tablet by mouth 2 (two) times daily.   cetirizine (ZYRTEC) 10 MG tablet Take 10 mg by mouth daily.   Cholecalciferol (VITAMIN D3) 1.25 MG (50000 UT) CAPS Take by mouth.   Coenzyme Q10 (COQ10) 100 MG CAPS Take 100 mg  by mouth daily.   Glucos-Chond-Hyal Ac-Ca Fructo (MOVE FREE JOINT HEALTH ADVANCE) TABS See admin instructions.   Magnesium 400 MG CAPS Take 400 mg by mouth daily.   Multiple Vitamins-Minerals (MULTIVITAMIN & MINERAL PO) Take 1 tablet by mouth daily.   rosuvastatin (CRESTOR) 10 MG tablet Take 10 mg by mouth daily.   TURMERIC PO Take 1,000 mg by mouth daily. (Liquid)   [DISCONTINUED] carvedilol (COREG) 6.25 MG tablet TAKE 1 TABLET TWICE DAILY  WITH MEALS   [DISCONTINUED] lisinopril (PRINIVIL,ZESTRIL) 20 MG tablet Take 1 tablet (20 mg total) by mouth daily.     Allergies:   Patient has no known allergies.   Social History   Socioeconomic History   Marital status: Single    Spouse name: Not on file   Number of children: 0   Years of education: Not on file   Highest education level: Not on file  Occupational History   Occupation: Chief Financial Officer    Comment: retired  Tobacco Use   Smoking status: Never   Smokeless tobacco: Never  Vaping Use   Vaping Use: Never used  Substance and Sexual Activity   Alcohol use: No    Alcohol/week: 0.0 standard drinks of alcohol   Drug use: No   Sexual activity: Yes  Other Topics Concern   Not on file  Social History Narrative   Not on file   Social Determinants of Health   Financial Resource Strain: Not on file  Food Insecurity: Not on file  Transportation Needs: Not on file  Physical Activity: Not on file  Stress: Not on file  Social Connections: Not on file     Family History: The patient's family history includes Cancer in his mother; Deep vein thrombosis in his sister; Lymphoma in his maternal grandmother.  ROS:   Please see the history of present illness.    + chest tightness All other systems reviewed and are negative.  Labs/Other Studies Reviewed:    The following studies were reviewed today:  Echo 11/13/19  1. Left ventricular ejection fraction, by estimation, is 50 to 55%. The  left ventricle has low normal function. The left  ventricle has no regional  wall motion abnormalities. Left ventricular diastolic function could not  be evaluated.   2. Right ventricular systolic function is normal. The right ventricular  size is normal.   3. Left atrial size was moderately dilated.   4. The mitral valve has been repaired/replaced. No evidence of mitral  valve regurgitation. There is a present in the mitral position.   5. The aortic valve is tricuspid. Aortic valve regurgitation is not  visualized. No aortic stenosis is present.   6. Aortic dilatation noted. There is borderline dilatation of the  ascending aorta measuring 37 mm.   7. The inferior vena cava is normal in size with greater than 50%  respiratory variability, suggesting  right atrial pressure of 3 mmHg.   Comparison(s): No significant change from prior study.    Recent Labs: 02/22/2022: ALT 11; BUN 14; Creatinine, Ser 0.86; Magnesium 2.4; Potassium 4.8; Sodium 138; TSH 2.500  Recent Lipid Panel No results found for: "CHOL", "TRIG", "HDL", "CHOLHDL", "VLDL", "LDLCALC", "LDLDIRECT"   Risk Assessment/Calculations:    CHA2DS2-VASc Score = 2  This indicates a 2.2% annual risk of stroke. The patient's score is based upon: CHF History: 0 HTN History: 1 Diabetes History: 0 Stroke History: 0 Vascular Disease History: 0 Age Score: 1 Gender Score: 0    Physical Exam:    VS:  BP 120/84   Pulse 80   Ht 5' 10.5" (1.791 m)   Wt 184 lb (83.5 kg)   BMI 26.03 kg/m     Wt Readings from Last 3 Encounters:  03/21/22 184 lb (83.5 kg)  02/22/22 184 lb 12.8 oz (83.8 kg)  10/27/20 187 lb 12.8 oz (85.2 kg)     GEN:  Well nourished, well developed in no acute distress HEENT: Normal NECK: No JVD; No carotid bruits CARDIAC: Irregular RR, no murmurs, rubs, gallops RESPIRATORY:  Clear to auscultation without rales, wheezing or rhonchi  ABDOMEN: Soft, non-tender, non-distended MUSCULOSKELETAL:  No edema; No deformity. 2+ pedal pulses, equal bilaterally SKIN:  Warm and dry NEUROLOGIC:  Alert and oriented x 3 PSYCHIATRIC:  Normal affect   EKG:  EKG is ordered today.  The ekg ordered today demonstrates atrial fibrillation at 80 bpm with occasional premature aberrantly conducted complexes  Diagnoses:    1. Persistent atrial fibrillation ([place])   2. DCM (dilated cardiomyopathy) ([place])   3. New onset a-fib ([place])   4. H/O mitral valve repair   5. PVC's (premature ventricular contractions)   6. Precordial pain   7. Mitral valve prolapse   8. Secondary hypercoagulable state ([place])   9. Essential hypertension    Assessment and Plan:     Persistent atrial fibrillation on chronic anticoagulation: EKG today reveals A-fib with well-controlled HR at 80 bpm.  No concerns for RVR. No palpitations. Continues to have "feeling of chaos in chest." Also describes some chest tightness. Continues to walk for exercise without significant symptoms. No missed doses of Eliquis, no bleeding concerns.  He requested that we increase his carvedilol and decrease his lisinopril in hopes that he may convert to NSR.  Risks of cardioversion were discussed and he would like to proceed. Will plan for follow-up 4-6 weeks after DCCV. He understands to notify us if any missed doses of [place]. Additionally, will refer to EP for consideration of ablation. Continue rate control with carvedilol.   Chest tightness: We discussed his symptoms of chest tightness that occur on inclines and at times with exertion.  Symptoms coincide with onset of atrial fibrillation.  We discussed further evaluation of ischemia after cardioversion if symptoms persist. Would favor coronary CTA if symptoms persist.   Dilated cardiomyopathy: LVEF 50-55%, unable to evaluate diastolic parameters on echo 11/2019. Occasional chest tightness on inclines, continues to walk at least 1.8 miles on most days without significant dyspnea or chest pain. Denies edema, orthopnea, PND.  Appears euvolemic on exam.  Weight is stable. Will  plan to repeat echo when sinus rhythm restored unless clinically indicated sooner.   MVP s/p repair: MV repair 2003.Stable mitral valve function on echo 11/2019.Chest tightness felt to be 2/2 a fib. As noted above, we will plan for repeat echo soon, preferably when he is NSR.   Hypertension:  BP  is well-controlled. Monitors closely at home and provides documentation of well-controlled BP and HR. We discussed current antihypertensive regimen and he would like to reduce dose of lisinopril and increase carvedilol to 12.5 mg twice daily in hopes that he may convert to NSR.  Advised him to continue to monitor BP and HR and to report any concern with this change.   Disposition: 4-6 weeks with Dr. Radford Pax or me  Shared Decision Making/Informed Consent The risks (stroke, cardiac arrhythmias rarely resulting in the need for a temporary or permanent pacemaker, skin irritation or burns and complications associated with conscious sedation including aspiration, arrhythmia, respiratory failure and death), benefits (restoration of normal sinus rhythm) and alternatives of a direct current cardioversion were explained in detail to Mr. Fecher and he agrees to proceed.    Medication Adjustments/Labs and Tests Ordered: Current medicines are reviewed at length with the patient today.  Concerns regarding medicines are outlined above.  Orders Placed This Encounter  Procedures   Basic Metabolic Panel (BMET)   CBC   Ambulatory referral to Cardiac Electrophysiology   EKG 12-Lead   Meds ordered this encounter  Medications   carvedilol (COREG) 12.5 MG tablet    Sig: Take 1 tablet (12.5 mg total) by mouth 2 (two) times daily with a meal.    Dispense:  60 tablet    Refill:  1   lisinopril (ZESTRIL) 10 MG tablet    Sig: Take 1 tablet (10 mg total) by mouth daily.    Dispense:  30 tablet    Refill:  1    Patient Instructions  Medication Instructions:   INCREASE Coreg one (1) tablet by mouth (12.5 mg) twice daily.    DECREASE Lisinopril one (1) tablet by mouth (10 mg) daily.   *If you need a refill on your cardiac medications before your next appointment, please call your pharmacy*   Lab Work:   Your physician recommends that you return for lab work on Thursday, September 14. You can come in on the day of your appointment anytime between 7:30-4:30.   If you have labs (blood work) drawn today and your tests are completely normal, you will receive your results only by: [place] (if you have MyChart) OR A paper copy in the mail If you have any lab test that is abnormal or we need to change your treatment, we will call you to review the results.   Testing/Procedures:  Dear Mr. Labrador, You are scheduled for a Cardioversion on Tuesday, September 19  with Dr. Margaretann Loveless.  Please arrive at the [facility] (Main Entrance A) at [facility]: [address] [place], [place] [address] at 11:45  am. (1 hour prior to procedure unless lab work is needed; if lab work is needed arrive 1.5 hours ahead)  DIET: Nothing to eat or drink after midnight except a sip of water with medications (see medication instructions below)  FYI: For your safety, and to allow [place] to monitor your vital signs accurately during the surgery/procedure we request that   if you have artificial nails, gel coating, SNS etc. Please have those removed prior to your surgery/procedure. Not having the nail coverings /polish removed may result in cancellation or delay of your surgery/procedure.   Medication Instructions:   Continue your anticoagulant: Eliquis You will need to continue your anticoagulant after your procedure until you  are told by your  Provider that it is safe to stop   Labs:   Come to the lab at [address] between the hours of 8:00 am and 4:30 pm. You do not have to be fasting. On Thursday September 14.   You must have a responsible person to drive you home and stay in the waiting area during your  procedure. Failure to do so could result in cancellation.  Bring your insurance cards.  *Special Note: Every effort is made to have your procedure done on time. Occasionally there are emergencies that occur at the hospital that may cause delays. Please be patient if a delay does occur.     Follow-Up: At [facility], you and your health needs are our priority.  As part of our continuing mission to provide you with exceptional heart care, we have created designated Provider Care Teams.  These Care Teams include your primary Cardiologist (physician) and Advanced Practice Providers (APPs -  Physician Assistants and Nurse Practitioners) who all work together to provide you with the care you need, when you need it.  We recommend signing up for the patient portal called "MyChart".  Sign up information is provided on this After Visit Summary.  MyChart is used to connect with patients for Virtual Visits (Telemedicine).  Patients are able to view lab/test results, encounter notes, upcoming appointments, etc.  Non-urgent messages can be sent to your provider as well.   To learn more about what you can do with MyChart, go to [email or web address].    Your next appointment:   6 week(s)  The format for your next appointment:   In Person  Provider:   Christen Bame, NP         Other Instructions  You have been referred to EP department for discussion of ablation.    Important Information About Sugar         Signed, Emmaline Life, NP  03/21/2022 4:43 PM    [facility]

## 2022-03-21 ENCOUNTER — Ambulatory Visit: Payer: Medicare Other | Attending: Nurse Practitioner | Admitting: Nurse Practitioner

## 2022-03-21 ENCOUNTER — Encounter: Payer: Self-pay | Admitting: Nurse Practitioner

## 2022-03-21 VITALS — BP 120/84 | HR 80 | Ht 70.5 in | Wt 184.0 lb

## 2022-03-21 DIAGNOSIS — I4819 Other persistent atrial fibrillation: Secondary | ICD-10-CM | POA: Insufficient documentation

## 2022-03-21 DIAGNOSIS — I4891 Unspecified atrial fibrillation: Secondary | ICD-10-CM | POA: Diagnosis present

## 2022-03-21 DIAGNOSIS — I10 Essential (primary) hypertension: Secondary | ICD-10-CM | POA: Diagnosis present

## 2022-03-21 DIAGNOSIS — I420 Dilated cardiomyopathy: Secondary | ICD-10-CM | POA: Diagnosis present

## 2022-03-21 DIAGNOSIS — R072 Precordial pain: Secondary | ICD-10-CM | POA: Insufficient documentation

## 2022-03-21 DIAGNOSIS — I341 Nonrheumatic mitral (valve) prolapse: Secondary | ICD-10-CM | POA: Insufficient documentation

## 2022-03-21 DIAGNOSIS — I493 Ventricular premature depolarization: Secondary | ICD-10-CM | POA: Insufficient documentation

## 2022-03-21 DIAGNOSIS — Z9889 Other specified postprocedural states: Secondary | ICD-10-CM | POA: Diagnosis present

## 2022-03-21 DIAGNOSIS — D6869 Other thrombophilia: Secondary | ICD-10-CM | POA: Diagnosis present

## 2022-03-21 MED ORDER — CARVEDILOL 12.5 MG PO TABS: 12.5000 mg | ORAL_TABLET | Freq: Two times a day (BID) | ORAL | 1 refills | Status: DC

## 2022-03-21 MED ORDER — LISINOPRIL 10 MG PO TABS: 10.0000 mg | ORAL_TABLET | Freq: Every day | ORAL | 1 refills | Status: DC

## 2022-03-21 NOTE — Patient Instructions (Signed)
Medication Instructions:   INCREASE Coreg one (1) tablet by mouth (12.5 mg) twice daily.   DECREASE Lisinopril one (1) tablet by mouth (10 mg) daily.   *If you need a refill on your cardiac medications before your next appointment, please call your pharmacy*   Lab Work:   Your physician recommends that you return for lab work on Thursday, September 14. You can come in on the day of your appointment anytime between 7:30-4:30.   If you have labs (blood work) drawn today and your tests are completely normal, you will receive your results only by: [place] (if you have MyChart) OR A paper copy in the mail If you have any lab test that is abnormal or we need to change your treatment, we will call you to review the results.   Testing/Procedures:  Dear Mr. Dahan, You are scheduled for a Cardioversion on Tuesday, September 19  with Dr. Margaretann Loveless.  Please arrive at the [facility] (Main Entrance A) at [facility]: [address] [place], [place] [address] at 11:45  am. (1 hour prior to procedure unless lab work is needed; if lab work is needed arrive 1.5 hours ahead)  DIET: Nothing to eat or drink after midnight except a sip of water with medications (see medication instructions below)  FYI: For your safety, and to allow [place] to monitor your vital signs accurately during the surgery/procedure we request that   if you have artificial nails, gel coating, SNS etc. Please have those removed prior to your surgery/procedure. Not having the nail coverings /polish removed may result in cancellation or delay of your surgery/procedure.   Medication Instructions:   Continue your anticoagulant: Eliquis You will need to continue your anticoagulant after your procedure until you  are told by your  Provider that it is safe to stop   Labs:   Come to the lab at [facility] between the hours of 8:00 am and 4:30 pm. You do not have to be fasting. On Thursday September 14.   You  must have a responsible person to drive you home and stay in the waiting area during your procedure. Failure to do so could result in cancellation.  Bring your insurance cards.  *Special Note: Every effort is made to have your procedure done on time. Occasionally there are emergencies that occur at the hospital that may cause delays. Please be patient if a delay does occur.     Follow-Up: At [facility], you and your health needs are our priority.  As part of our continuing mission to provide you with exceptional heart care, we have created designated Provider Care Teams.  These Care Teams include your primary Cardiologist (physician) and Advanced Practice Providers (APPs -  Physician Assistants and Nurse Practitioners) who all work together to provide you with the care you need, when you need it.  We recommend signing up for the patient portal called "MyChart".  Sign up information is provided on this After Visit Summary.  MyChart is used to connect with patients for Virtual Visits (Telemedicine).  Patients are able to view lab/test results, encounter notes, upcoming appointments, etc.  Non-urgent messages can be sent to your provider as well.   To learn more about what you can do with MyChart, go to [email or web address].    Your next appointment:   6 week(s)  The format for your next appointment:   In Person  Provider:   Christen Bame, NP  Other Instructions  You have been referred to EP department for discussion of ablation.    Important Information About Sugar

## 2022-03-22 ENCOUNTER — Encounter (HOSPITAL_COMMUNITY): Payer: Self-pay | Admitting: Internal Medicine

## 2022-03-24 ENCOUNTER — Ambulatory Visit: Payer: Medicare Other | Attending: Physician Assistant

## 2022-03-24 DIAGNOSIS — I420 Dilated cardiomyopathy: Secondary | ICD-10-CM | POA: Insufficient documentation

## 2022-03-24 DIAGNOSIS — Z9889 Other specified postprocedural states: Secondary | ICD-10-CM | POA: Insufficient documentation

## 2022-03-24 DIAGNOSIS — I493 Ventricular premature depolarization: Secondary | ICD-10-CM | POA: Insufficient documentation

## 2022-03-24 DIAGNOSIS — I4891 Unspecified atrial fibrillation: Secondary | ICD-10-CM | POA: Insufficient documentation

## 2022-03-24 LAB — CBC
Hematocrit: 46.1 % (ref 37.5–51.0)
Hemoglobin: 15.7 g/dL (ref 13.0–17.7)
MCH: 32.5 pg (ref 26.6–33.0)
MCHC: 34.1 g/dL (ref 31.5–35.7)
MCV: 95 fL (ref 79–97)
Platelets: 210 10*3/uL (ref 150–450)
RBC: 4.83 x10E6/uL (ref 4.14–5.80)
RDW: 12.8 % (ref 11.6–15.4)
WBC: 5.6 10*3/uL (ref 3.4–10.8)

## 2022-03-24 LAB — BASIC METABOLIC PANEL
BUN/Creatinine Ratio: 17 (ref 10–24)
BUN: 15 mg/dL (ref 8–27)
CO2: 31 mmol/L — ABNORMAL HIGH (ref 20–29)
Calcium: 10.2 mg/dL (ref 8.6–10.2)
Chloride: 103 mmol/L (ref 96–106)
Creatinine, Ser: 0.9 mg/dL (ref 0.76–1.27)
Glucose: 89 mg/dL (ref 70–99)
Potassium: 5.2 mmol/L (ref 3.5–5.2)
Sodium: 140 mmol/L (ref 134–144)
eGFR: 94 mL/min/{1.73_m2} (ref >59)

## 2022-03-29 ENCOUNTER — Other Ambulatory Visit: Payer: Self-pay

## 2022-03-29 ENCOUNTER — Ambulatory Visit (HOSPITAL_COMMUNITY)
Admission: RE | Admit: 2022-03-29 | Discharge: 2022-03-29 | Disposition: A | Discharge status: Home / Self Care | Payer: Medicare Other | Attending: Internal Medicine | Admitting: Internal Medicine

## 2022-03-29 ENCOUNTER — Encounter (HOSPITAL_COMMUNITY)
Admission: RE | Disposition: A | Discharge status: Home / Self Care | Payer: Self-pay | Source: Home / Self Care | Attending: Internal Medicine

## 2022-03-29 ENCOUNTER — Encounter (HOSPITAL_COMMUNITY): Payer: Self-pay | Admitting: Internal Medicine

## 2022-03-29 ENCOUNTER — Ambulatory Visit (HOSPITAL_BASED_OUTPATIENT_CLINIC_OR_DEPARTMENT_OTHER): Payer: Medicare Other | Admitting: Anesthesiology

## 2022-03-29 ENCOUNTER — Ambulatory Visit (HOSPITAL_COMMUNITY): Payer: Medicare Other | Admitting: Anesthesiology

## 2022-03-29 DIAGNOSIS — R002 Palpitations: Secondary | ICD-10-CM | POA: Diagnosis present

## 2022-03-29 DIAGNOSIS — I509 Heart failure, unspecified: Secondary | ICD-10-CM

## 2022-03-29 DIAGNOSIS — I4891 Unspecified atrial fibrillation: Secondary | ICD-10-CM | POA: Diagnosis not present

## 2022-03-29 DIAGNOSIS — I420 Dilated cardiomyopathy: Secondary | ICD-10-CM | POA: Diagnosis not present

## 2022-03-29 DIAGNOSIS — I493 Ventricular premature depolarization: Secondary | ICD-10-CM | POA: Insufficient documentation

## 2022-03-29 DIAGNOSIS — I110 Hypertensive heart disease with heart failure: Secondary | ICD-10-CM

## 2022-03-29 DIAGNOSIS — Z79899 Other long term (current) drug therapy: Secondary | ICD-10-CM | POA: Diagnosis not present

## 2022-03-29 DIAGNOSIS — I4819 Other persistent atrial fibrillation: Secondary | ICD-10-CM | POA: Diagnosis not present

## 2022-03-29 DIAGNOSIS — M1990 Unspecified osteoarthritis, unspecified site: Secondary | ICD-10-CM | POA: Diagnosis not present

## 2022-03-29 DIAGNOSIS — Z7901 Long term (current) use of anticoagulants: Secondary | ICD-10-CM | POA: Insufficient documentation

## 2022-03-29 HISTORY — PX: CARDIOVERSION: SHX1299

## 2022-03-29 SURGERY — CARDIOVERSION
Anesthesia: General

## 2022-03-29 MED ORDER — CARVEDILOL 12.5 MG PO TABS: 6.2500 mg | ORAL_TABLET | Freq: Two times a day (BID) | ORAL | 1 refills | Status: DC

## 2022-03-29 MED ORDER — LIDOCAINE 2% (20 MG/ML) 5 ML SYRINGE
INTRAMUSCULAR | Status: DC | PRN
  Administered 2022-03-29: 80 mg via INTRAVENOUS

## 2022-03-29 MED ORDER — PROPOFOL 10 MG/ML IV BOLUS
INTRAVENOUS | Status: DC | PRN
  Administered 2022-03-29: 30 mg via INTRAVENOUS
  Administered 2022-03-29: 70 mg via INTRAVENOUS

## 2022-03-29 MED ORDER — SODIUM CHLORIDE 0.9 % IV SOLN: INTRAVENOUS | Status: DC

## 2022-03-29 NOTE — Transfer of Care (Signed)
Immediate Anesthesia Transfer of Care Note  Patient: Jordan Payne  Procedure(s) Performed: CARDIOVERSION  Patient Location: PACU  Anesthesia Type:General  Level of Consciousness: awake and drowsy  Airway & Oxygen Therapy: Patient Spontanous Breathing and Patient connected to face mask oxygen  Post-op Assessment: Report given to RN and Post -op Vital signs reviewed and stable  Post vital signs: Reviewed and stable  Last Vitals:  Vitals Value Taken Time  BP    Temp    Pulse 74 03/29/22 1223  Resp 14 03/29/22 1223  SpO2 100 % 03/29/22 1223  Vitals shown include unvalidated device data.  Last Pain:  Vitals:   03/29/22 1203  TempSrc: Tympanic  PainSc: 0-No pain         Complications: No notable events documented.

## 2022-03-29 NOTE — Anesthesia Postprocedure Evaluation (Signed)
Anesthesia Post Note  Patient: Jordan Payne  Procedure(s) Performed: CARDIOVERSION     Patient location during evaluation: PACU Anesthesia Type: General Level of consciousness: sedated and patient cooperative Pain management: pain level controlled Vital Signs Assessment: post-procedure vital signs reviewed and stable Respiratory status: spontaneous breathing Cardiovascular status: stable Anesthetic complications: no   No notable events documented.  Last Vitals:  Vitals:   03/29/22 1240 03/29/22 1246  BP: 117/86 127/89  Pulse: 64 72  Resp: 14 16  Temp:    SpO2: 94% 96%    Last Pain:  Vitals:   03/29/22 1246  TempSrc:   PainSc: 0-No pain                 Nolon Nations

## 2022-03-29 NOTE — Anesthesia Preprocedure Evaluation (Addendum)
Anesthesia Evaluation  Patient identified by MRN, date of birth, ID band Patient awake    Reviewed: Allergy & Precautions, NPO status , Patient's Chart, lab work & pertinent test results  Airway Mallampati: II  TM Distance: >3 FB Neck ROM: Full    Dental  (+) Dental Advisory Given, Teeth Intact   Pulmonary neg pulmonary ROS,    Pulmonary exam normal breath sounds clear to auscultation       Cardiovascular hypertension, Pt. on medications and Pt. on home beta blockers +CHF  Normal cardiovascular exam+ Valvular Problems/Murmurs  Rhythm:Regular Rate:Normal  Echo 2021 1. Left ventricular ejection fraction, by estimation, is 50 to 55%. The left ventricle has low normal function. The left ventricle has no regional wall motion abnormalities. Left ventricular diastolic function could not be evaluated.  2. Right ventricular systolic function is normal. The right ventricular size is normal.  3. Left atrial size was moderately dilated.  4. The mitral valve has been repaired/replaced. No evidence of mitral valve regurgitation. There is a present in the mitral position.  5. The aortic valve is tricuspid. Aortic valve regurgitation is not visualized. No aortic stenosis is present.  6. Aortic dilatation noted. There is borderline dilatation of the ascending aorta measuring 37 mm.  7. The inferior vena cava is normal in size with greater than 50% respiratory variability, suggesting right atrial pressure of 3 mmHg.      Neuro/Psych negative neurological ROS     GI/Hepatic negative GI ROS, Neg liver ROS,   Endo/Other  negative endocrine ROS  Renal/GU negative Renal ROS     Musculoskeletal  (+) Arthritis ,   Abdominal   Peds  Hematology negative hematology ROS (+)   Anesthesia Other Findings   Reproductive/Obstetrics                            Anesthesia Physical Anesthesia Plan  ASA:  3  Anesthesia Plan: General   Post-op Pain Management: Minimal or no pain anticipated   Induction: Intravenous  PONV Risk Score and Plan: 2 and Propofol infusion, TIVA and Treatment may vary due to age or medical condition  Airway Management Planned: Natural Airway and Mask  Additional Equipment:   Intra-op Plan:   Post-operative Plan:   Informed Consent: I have reviewed the patients History and Physical, chart, labs and discussed the procedure including the risks, benefits and alternatives for the proposed anesthesia with the patient or authorized representative who has indicated his/her understanding and acceptance.     Dental advisory given  Plan Discussed with: CRNA  Anesthesia Plan Comments:        Anesthesia Quick Evaluation

## 2022-03-29 NOTE — CV Procedure (Signed)
Procedure: Electrical Cardioversion Indications:  Atrial Fibrillation  Procedure Details:  Consent: Risks of procedure as well as the alternatives and risks of each were explained to the (patient/caregiver).  Consent for procedure obtained.  Time Out: Verified patient identification, verified procedure, site/side was marked, verified correct patient position, special equipment/implants available, medications/allergies/relevent history reviewed, required imaging and test results available. PERFORMED.  Patient placed on cardiac monitor, pulse oximetry, supplemental oxygen as necessary.  Sedation given:  propofol per anesthesia Pacer pads placed anterior and posterior chest.  Cardioverted 1 time(s).  Cardioversion with synchronized biphasic 120J shock.  Evaluation: Findings: Post procedure EKG shows: NSR Complications: None Patient did tolerate procedure well.  Time Spent Directly with the Patient:  30 minutes   Jordan Payne 03/29/2022, 12:19 PM

## 2022-03-29 NOTE — Discharge Instructions (Signed)

## 2022-03-29 NOTE — Interval H&P Note (Signed)
History and Physical Interval Note:  03/29/2022 11:54 AM  Jordan Payne  has presented today for surgery, with the diagnosis of atrial fibrillation.  The various methods of treatment have been discussed with the patient and family. After consideration of risks, benefits and other options for treatment, the patient has consented to  Procedure(s): CARDIOVERSION (N/A) as a surgical intervention.  The patient's history has been reviewed, patient examined, no change in status, stable for surgery.  I have reviewed the patient's chart and labs.  Questions were answered to the patient's satisfaction.     Elouise Munroe

## 2022-05-02 NOTE — H&P (View-Only) (Signed)
Cardiology Office Note:    Date:  05/03/2022   ID:  Vu Liebman, DOB 22-Jun-1956, MRN [id number]  PCP:  Lawerance Cruel, [place] [facility] Cardiologist:  Fransico Him, MD Electrophysiologist:  Melida Quitter, MD     Referring MD: Emmaline Life, NP   Chief complaint: palpitations, fatigue, increased heart rate  History of Present Illness:    Jordan Payne is a 66 y.o. male with a hx of MVP s/p MV repair, HTN, dilated cardiomyopathy with recovered EF, frequent PVCs, and atrial fibrillation.  He underwent mitral valve repair in 2003. EF was moderately reduced, 40-45% in 2015 but had recovered to 50-55% by 2017.  He was diagnosed with AF in August 2023. ECGs from 8/15 and 9/11 2023 show AF. During the 9/11 visit, he reported increased fatigue, lightheadedness and palpitations described as "feeling of chaos" in his chest. He underwent DC cardioversion later in the month.  He remained in sinus rhythm for no more than 4 days.  He has a phone app that monitors snoring and noted that the night before the AF recurrent showed high uptick in snoring intensity.   Past Medical History:  Diagnosis Date   Arthritis    DCM (dilated cardiomyopathy) ([place])    EF 40-45% by echo 2015.  EF 55% by echo 2017. EF 50-55% by echo 2021   Hypertension    MVP (mitral valve prolapse)    s/p repair   Prostate cancer ([place])    PVC's (premature ventricular contractions)    Tibialis posterior rupture, right, initial encounter    right foot    Past Surgical History:  Procedure Laterality Date   APPENDECTOMY     CARDIOVERSION N/A 03/29/2022   Procedure: CARDIOVERSION;  Surgeon: Elouise Munroe, MD;  Location: [facility];  Service: Cardiovascular;  Laterality: N/A;   PROSTATE BIOPSY     robotic mitral valve repair     TENDON TRANSPLANT     PT tendon transplant and calcaneal osteotomy   TONSILLECTOMY      Current Medications: Current Meds  Medication Sig   apixaban  (ELIQUIS) 5 MG TABS tablet Take 1 tablet (5 mg total) by mouth 2 (two) times daily.   Ascorbic Acid (VITAMIN C/ROSE HIPS TR) 1000 MG TBCR Take 1,000 mg by mouth in the morning.   Calcium Carb-Cholecalciferol (CALCIUM 600+D3 PO) Take 1 tablet by mouth in the morning and at bedtime.   carvedilol (COREG) 12.5 MG tablet Take 0.5 tablets (6.25 mg total) by mouth 2 (two) times daily with a meal.   cetirizine (ZYRTEC) 10 MG tablet Take 10 mg by mouth in the morning.   Cholecalciferol (VITAMIN D3) 125 MCG (5000 UT) TABS Take 5,000 Units by mouth in the morning.   Coenzyme Q10 (COQ10 PO) Take 300 mg by mouth in the morning.   Glucosamine-Chondroit-Calcium (TRIPLE FLEX BONE & JOINT PO) Take 1 tablet by mouth in the morning. [facility] Signature Triple Action Joint Health   lisinopril (ZESTRIL) 10 MG tablet Take 1 tablet (10 mg total) by mouth daily.   magnesium oxide (MAG-OX) 400 (240 Mg) MG tablet Take 400 mg by mouth every evening.   Multiple Vitamin (MULTIVITAMIN WITH MINERALS) TABS tablet Take 1 tablet by mouth daily. [facility] Signature Adult 50+ Mature Multi Vitamins & Minerals   rosuvastatin (CRESTOR) 10 MG tablet Take 10 mg by mouth every evening.   TURMERIC-GINGER PO Take 1 tablet by mouth in the morning and at bedtime.     Allergies:  Patient has no known allergies.   Social History   Socioeconomic History   Marital status: Single    Spouse name: Not on file   Number of children: 0   Years of education: Not on file   Highest education level: Not on file  Occupational History   Occupation: Chief Financial Officer    Comment: retired  Tobacco Use   Smoking status: Never   Smokeless tobacco: Never  Vaping Use   Vaping Use: Never used  Substance and Sexual Activity   Alcohol use: No    Alcohol/week: 0.0 standard drinks of alcohol   Drug use: No   Sexual activity: Yes  Other Topics Concern   Not on file  Social History Narrative   Not on file   Social Determinants of Health   Financial  Resource Strain: Not on file  Food Insecurity: Not on file  Transportation Needs: Not on file  Physical Activity: Not on file  Stress: Not on file  Social Connections: Not on file     Family History: The patient's family history includes Cancer in his mother; Deep vein thrombosis in his sister; Lymphoma in his maternal grandmother.  ROS:   Please see the history of present illness.    All other systems reviewed and are negative.  EKGs/Labs/Other Studies Reviewed:     TTE 11/2019 EF 50-55%, left atrium is moderately dilated  EKG:  Last EKG results: Today - AF with controlled rate   Recent Labs: 02/22/2022: ALT 11; Magnesium 2.4; TSH 2.500 03/24/2022: BUN 15; Creatinine, Ser 0.90; Hemoglobin 15.7; Platelets 210; Potassium 5.2; Sodium 140    Risk Assessment/Calculations:    CHA2DS2-VASc Score = 2   This indicates a 2.2% annual risk of stroke. The patient's score is based upon: CHF History: 0 HTN History: 1 Diabetes History: 0 Stroke History: 0 Vascular Disease History: 0 Age Score: 1 Gender Score: 0         Physical Exam:    VS:  BP 120/80   Pulse 87   Ht 5' 10.5" (1.791 m)   Wt 186 lb (84.4 kg)   SpO2 97%   BMI 26.31 kg/m     Wt Readings from Last 3 Encounters:  05/03/22 186 lb (84.4 kg)  03/29/22 179 lb (81.2 kg)  03/21/22 184 lb (83.5 kg)     GEN:  Well nourished, well developed in no acute distress CARDIAC: Irregular, no murmurs, rubs, gallops RESPIRATORY:  Normal work of breathing MUSCULOSKELETAL: no edema    ASSESSMENT & PLAN:    Persistent atrial fibrillation: symptomatic despite rate control. We discussed management options. We discussed the indication, rationale, logistics, anticipated benefits, and potential risks of the ablation procedure including but not limited to -- bleed at the groin access site, chest pain, damage to nearby organs such as the diaphragm, lungs, or esophagus, need for a drainage tube, or prolonged hospitalization. I  explained that the risk for stroke, heart attack, need for open chest surgery, or even death is very low but not zero. He expressed understanding and wishes to proceed.  Longterm use of anticoagulation: CHADS2VASC is 2. Continue eliquis. Cardiomyopathy: recovered Frequent PVCs MVP s/p repair Suspected OSA: he has an app that monitors snoring and has had some correlation with recurrence of AF. Will order home sleep study.     Medication Adjustments/Labs and Tests Ordered: Current medicines are reviewed at length with the patient today.  Concerns regarding medicines are outlined above.  Orders Placed This Encounter  Procedures   CT  CARDIAC MORPH/PULM VEIN W/CM&W/O CA SCORE   CBC with Differential/Platelet   Basic metabolic panel   EKG [id number]   [place] Sleep Study   No orders of the defined types were placed in this encounter.    Signed, Melida Quitter, MD  05/03/2022 9:35 AM    [place]

## 2022-05-02 NOTE — Progress Notes (Signed)
Cardiology Office Note:    Date:  05/06/2022   ID:  Jaymari Cromie, DOB 1956/01/02, MRN [id number]  PCP:  Lawerance Cruel, MD   [facility] Cardiologist:  Fransico Him, MD Electrophysiologist:  Melida Quitter, MD     Referring MD: Lawerance Cruel, MD   Chief Complaint: palpitations  History of Present Illness:    Jordan Payne is a very pleasant 66 y.o. male with a hx of MVP s/p MV repair, HTN, dilated cardiomyopathy with normalized EF, and frequent PVCs.    History of MVP s/p MV repair 2003 with reported normal coronary arteries at the time. LVEF 40-45% on echo 2015, improved to 50-55% on echo 2017, 2021.   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 He was last seen in our office on 10/27/2020 by Dr. Radford Pax at which time one year follow-up was recommended. Cleared for hernia repair on 10/06/21 by Almyra Deforest, PA.   Seen by me on 02/22/22 for evaluation of palpitations, elevated heart rate, and saw abnormal ekg on BP monitor x 1 week.  Has been doing a lot of reading and asks appropriate questions about recent viral illness April 2023 for which he took steroids. Took him 2 1/2 weeks to recover. Had hernia surgery 11/29/21, no problems. Had pneumonia vaccine 02/18/22 wonders if these things exacerbated a fib. Also read to limit alcohol and admits that he occasionally has 4-5 drinks on one occasion. Volunteers as DJ on weekends.  Had stopped walking prior to viral illness but was previously walking 3 miles daily. Has resumed this activity this week. Notes more SOB with exertion. Previously cycled strenuously prior to MV surgery 2003. Has an upcoming facet ablation for arthritis. Monitors snoring with an app, negative sleep apnea many years ago. Monitors home HR and BP. BP on occasion has been low -  95/65. HR has been running > 90 bpm frequently. He denies lightheadedness, presyncope, syncope.   Last visit in cardiology clinic was with me on 03/21/22. Reported vague symptoms he wonders whether are associated with atrial fibrillation or with side effects of medications.Occasional  lightheadedness about 1 hour after eating. Often is fatigued when he wakes up and fatigued in the afternoon, but this seems to be getting better. Walks 1.8 to 5.3 miles daily and has to start out from his home on an incline. Feels mild chest tightness when starting up the hill but is able to complete his walk without any concerning symptoms. "Feeling of chaos" in chest improving. Did an experiment of zero alcohol and caffeine for 2 weeks  with no improvement in symptoms. He denied shortness of breath, lower extremity edema, palpitations, melena, hematuria, diaphoresis, weakness, presyncope, syncope, orthopnea, and PND. Risks of cardioversion were discussed and he elected to proceed. Underwent successful DCCV on 03/29/22.   Today, he is here alone for follow-up. He has a list of questions to review. His carvedilol dose was reduced to 6.25 mg at his cardioversion by Dr. Margaretann Loveless due to recent soft BP readings. He is feeling well on his current doses of medications. Had about 6 days of NSR and then return of A-fib.  He had appointment with Dr. Myles Gip on 05/03/2022 and has decided to proceed with A-fib ablation which is scheduled for 05/23/2022. Dr. Myles Gip also ordered a home sleep study. He has recorded his snoring on  an app but past testing revealed no sleep apnea.  He remains very active walking 2 to 4 miles daily as well as work around his home.  At last clinic visit, we reported mild chest tightness. It is still intermittent and is not associated with n/v or diaphoresis but is associated with shortness of breath on occasion. Took a 2 1/2 mile walk on 10/26 and initially felt some chest tightness but later was able to go up a steep  incline with no symptoms walking at a fast pace. No orthopnea or PND. Reports that he will need a prostate biopsy in the future. Advised him to tentatively schedule for late February/March at which time he will be 3 months post ablation.   Past Medical History:  Diagnosis Date   Arthritis    DCM (dilated cardiomyopathy) ([place])    EF 40-45% by echo 2015.  EF 55% by echo 2017. EF 50-55% by echo 2021   Hypertension    MVP (mitral valve prolapse)    s/p repair   Prostate cancer ([place])    PVC's (premature ventricular contractions)    Tibialis posterior rupture, right, initial encounter    right foot    Past Surgical History:  Procedure Laterality Date   APPENDECTOMY     CARDIOVERSION N/A 03/29/2022   Procedure: CARDIOVERSION;  Surgeon: Elouise Munroe, MD;  Location: [facility];  Service: Cardiovascular;  Laterality: N/A;   PROSTATE BIOPSY     robotic mitral valve repair     TENDON TRANSPLANT     PT tendon transplant and calcaneal osteotomy   TONSILLECTOMY      Current Medications: Current Meds  Medication Sig   apixaban (ELIQUIS) 5 MG TABS tablet Take 1 tablet (5 mg total) by mouth 2 (two) times daily.   Ascorbic Acid (VITAMIN C/ROSE HIPS TR) 1000 MG TBCR Take 1,000 mg by mouth in the morning.   Calcium Carb-Cholecalciferol (CALCIUM 600+D3 PO) Take 1 tablet by mouth in the morning and at bedtime.   carvedilol (COREG) 12.5 MG tablet Take 0.5 tablets (6.25 mg total) by mouth 2 (two) times daily with a meal.   cetirizine (ZYRTEC) 10 MG tablet Take 10 mg by mouth in the morning.   Cholecalciferol (VITAMIN D3) 125 MCG (5000 UT) TABS Take 5,000 Units by mouth in the morning.   Coenzyme Q10 (COQ10 PO) Take 300 mg by mouth in the morning.   Glucosamine-Chondroit-Calcium (TRIPLE FLEX BONE & JOINT PO) Take 1 tablet by mouth in the morning. [facility] Signature Triple Action Joint Health   lisinopril (ZESTRIL) 10 MG tablet Take 1 tablet (10 mg total) by mouth daily.   magnesium oxide  (MAG-OX) 400 (240 Mg) MG tablet Take 400 mg by mouth every evening.   Multiple Vitamin (MULTIVITAMIN WITH MINERALS) TABS tablet Take 1 tablet by mouth daily. [facility] Signature Adult 50+ Mature Multi Vitamins & Minerals   rosuvastatin (CRESTOR) 10 MG tablet Take 10 mg by mouth every evening.   TURMERIC-GINGER PO Take 1 tablet by mouth in the morning and at bedtime.     Allergies:   Patient has no known allergies.   Social History   Socioeconomic History   Marital status: Single    Spouse name: Not on file   Number of children: 0   Years of education: Not on file   Highest education level: Not on file  Occupational History   Occupation: Chief Financial Officer    Comment: retired  Tobacco Use   Smoking status: Never   Smokeless tobacco:  Never  Vaping Use   Vaping Use: Never used  Substance and Sexual Activity   Alcohol use: No    Alcohol/week: 0.0 standard drinks of alcohol   Drug use: No   Sexual activity: Yes  Other Topics Concern   Not on file  Social History Narrative   Not on file   Social Determinants of Health   Financial Resource Strain: Not on file  Food Insecurity: Not on file  Transportation Needs: Not on file  Physical Activity: Not on file  Stress: Not on file  Social Connections: Not on file     Family History: The patient's family history includes Cancer in his mother; Deep vein thrombosis in his sister; Lymphoma in his maternal grandmother.  ROS:   Please see the history of present illness.    + occasional chest pressure, DOE All other systems reviewed and are negative.  Labs/Other Studies Reviewed:    The following studies were reviewed today:  Echo 11/13/19  1. Left ventricular ejection fraction, by estimation, is 50 to 55%. The  left ventricle has low normal function. The left ventricle has no regional  wall motion abnormalities. Left ventricular diastolic function could not  be evaluated.   2. Right ventricular systolic function is normal. The right  ventricular  size is normal.   3. Left atrial size was moderately dilated.   4. The mitral valve has been repaired/replaced. No evidence of mitral  valve regurgitation. There is a present in the mitral position.   5. The aortic valve is tricuspid. Aortic valve regurgitation is not  visualized. No aortic stenosis is present.   6. Aortic dilatation noted. There is borderline dilatation of the  ascending aorta measuring 37 mm.   7. The inferior vena cava is normal in size with greater than 50%  respiratory variability, suggesting right atrial pressure of 3 mmHg.   Comparison(s): No significant change from prior study.    Recent Labs: 02/22/2022: ALT 11; Magnesium 2.4; TSH 2.500 05/03/2022: BUN 17; Creatinine, Ser 0.88; Hemoglobin 15.6; Platelets 256; Potassium 4.7; Sodium 142   Risk Assessment/Calculations:    CHA2DS2-VASc Score = 2  This indicates a 2.2% annual risk of stroke. The patient's score is based upon: CHF History: 0 HTN History: 1 Diabetes History: 0 Stroke History: 0 Vascular Disease History: 0 Age Score: 1 Gender Score: 0    Physical Exam:    VS:  BP 102/66   Pulse 86   Ht 5' 10.5" (1.791 m)   Wt 185 lb 12.8 oz (84.3 kg)   SpO2 97%   BMI 26.28 kg/m     Wt Readings from Last 3 Encounters:  05/06/22 185 lb 12.8 oz (84.3 kg)  05/03/22 186 lb (84.4 kg)  03/29/22 179 lb (81.2 kg)     GEN:  Well nourished, well developed in no acute distress HEENT: Normal NECK: No JVD; No carotid bruits CARDIAC: Irregular RR, no murmurs, rubs, gallops RESPIRATORY:  Clear to auscultation without rales, wheezing or rhonchi  ABDOMEN: Soft, non-tender, non-distended MUSCULOSKELETAL:  No edema; No deformity. 2+ pedal pulses, equal bilaterally SKIN: Warm and dry NEUROLOGIC:  Alert and oriented x 3 PSYCHIATRIC:  Normal affect   EKG:  EKG is not ordered today  Diagnoses:    1. Persistent atrial fibrillation ([place])   2. H/O mitral valve repair   3. Secondary hypercoagulable  state ([place])   4. Essential hypertension   5. Chest pressure   6. DCM (dilated cardiomyopathy) ([place])     Assessment  and Plan:     Persistent atrial fibrillation on chronic anticoagulation: Return of A-fib no concerns for RVR. Symptom of a fib is "feeling of chaos in chest." Also describes some chest tightness. Continues to walk for exercise without significant symptoms. No missed doses of Eliquis, no bleeding concerns. We previously increased his carvedilol and reduced lisinopril but BP was soft so carvedilol was reduced back to 6.25.   Chest tightness: Continues to have  occasional chest tightness that occurs with exertion but not consistently. Also has occasional shortness of breath. No n/v, diaphoresis. Somewhat coincides with onset of atrial fibrillation. Lengthy discussion about ischemic evaluation. Through shared decision making, he would like to continue to monitor and notify us if symptoms worsen prior to next office visit. He is hopeful that symptoms will resolve when he is in normal sinus rhythm.  Dilated cardiomyopathy: LVEF 50-55%, unable to evaluate diastolic parameters on echo 11/2019. Occasional chest tightness on inclines, continues to walk 2-4 miles on most days without significant dyspnea or chest pain. Denies edema, orthopnea, PND.  Appears euvolemic on exam. Weight is stable. Will plan to repeat echo when sinus rhythm restored unless clinically indicated sooner.   MVP s/p repair: MV repair 2003.Stable mitral valve function on echo 11/2019. No significant murmur on exam. Will plan for repeat echo soon, preferably when he is NSR.   Hypertension:  BP is well-controlled on current regimen. Advised him to continue to monitor BP and HR and to report any concern with this change.   Disposition: Keep your January appointment with Dr. Radford Pax   Medication Adjustments/Labs and Tests Ordered: Current medicines are reviewed at length with the patient today.  Concerns regarding medicines  are outlined above.  No orders of the defined types were placed in this encounter.  No orders of the defined types were placed in this encounter.   Patient Instructions  Medication Instructions:  Your physician recommends that you continue on your current medications as directed. Please refer to the Current Medication list given to you today.  *If you need a refill on your cardiac medications before your next appointment, please call your pharmacy*  Lab Work: NONE  Testing/Procedures: NONE  Follow-Up: Keep all upcoming appointments as scheduled.  Other Instructions Atrial Fibrillation and Exercise Even though you have atrial fibrillation, your provider still recommends that you participate in 150 minutes of moderate-to-intense exercise every week.  Some examples are swimming, dancing, yoga, golf, tennis, elliptical, walking, running, gardening and cycling.  You should also try to participate in muscle-strengthening activity, such as resistance bands or weightlifting, at least 2 days per week.  The best form of exercise is the one you will keep doing because you enjoy it!!  Exercise makes your heart stronger and can even prevent atrial fibrillation.  Hydration is important.  Drink water throughout the day and especially when you are sweating.  Take your medications regularly and follow instructions regarding food intake and timing.  Your heart rate may speed up or feel more irregular with exercise.  Do not be alarmed unless you have chest pain, difficulty breathing, or feel faint.    Listen to your body!  If you do not feel "right", stop and rest.  If your symptoms do not improve, seek emergency medical attention.  The following chart helps you to know how hard you should be working:     [place]         Signed, Emmaline Life, NP  05/06/2022 2:35 PM  [facility]

## 2022-05-02 NOTE — Progress Notes (Signed)
Cardiology Office Note:    Date:  05/03/2022   ID:  Jordan Payne, DOB 06-23-1956, MRN [id number]  PCP:  Jordan Payne, [place] Providers Cardiologist:  Jordan Him, MD Electrophysiologist:  Jordan Quitter, MD     Referring MD: Jordan Life, NP   Chief complaint: palpitations, fatigue, increased heart rate  History of Present Illness:    Jordan Payne is a 66 y.o. male with a hx of MVP s/p MV repair, HTN, dilated cardiomyopathy with recovered EF, frequent PVCs, and atrial fibrillation.  He underwent mitral valve repair in 2003. EF was moderately reduced, 40-45% in 2015 but had recovered to 50-55% by 2017.  He was diagnosed with AF in August 2023. ECGs from 8/15 and 9/11 2023 show AF. During the 9/11 visit, he reported increased fatigue, lightheadedness and palpitations described as "feeling of chaos" in his chest. He underwent DC cardioversion later in the month.  He remained in sinus rhythm for no more than 4 days.  He has a phone app that monitors snoring and noted that the night before the AF recurrent showed high uptick in snoring intensity.   Past Medical History:  Diagnosis Date   Arthritis    DCM (dilated cardiomyopathy) ([place])    EF 40-45% by echo 2015.  EF 55% by echo 2017. EF 50-55% by echo 2021   Hypertension    MVP (mitral valve prolapse)    s/p repair   Prostate cancer ([place])    PVC's (premature ventricular contractions)    Tibialis posterior rupture, right, initial encounter    right foot    Past Surgical History:  Procedure Laterality Date   APPENDECTOMY     CARDIOVERSION N/A 03/29/2022   Procedure: CARDIOVERSION;  Surgeon: Elouise Munroe, MD;  Location: [facility];  Service: Cardiovascular;  Laterality: N/A;   PROSTATE BIOPSY     robotic mitral valve repair     TENDON TRANSPLANT     PT tendon transplant and calcaneal osteotomy   TONSILLECTOMY      Current Medications: Current Meds  Medication Sig   apixaban  (ELIQUIS) 5 MG TABS tablet Take 1 tablet (5 mg total) by mouth 2 (two) times daily.   Ascorbic Acid (VITAMIN C/ROSE HIPS TR) 1000 MG TBCR Take 1,000 mg by mouth in the morning.   Calcium Carb-Cholecalciferol (CALCIUM 600+D3 PO) Take 1 tablet by mouth in the morning and at bedtime.   carvedilol (COREG) 12.5 MG tablet Take 0.5 tablets (6.25 mg total) by mouth 2 (two) times daily with a meal.   cetirizine (ZYRTEC) 10 MG tablet Take 10 mg by mouth in the morning.   Cholecalciferol (VITAMIN D3) 125 MCG (5000 UT) TABS Take 5,000 Units by mouth in the morning.   Coenzyme Q10 (COQ10 PO) Take 300 mg by mouth in the morning.   Glucosamine-Chondroit-Calcium (TRIPLE FLEX BONE & JOINT PO) Take 1 tablet by mouth in the morning. [facility] Signature Triple Action Joint Health   lisinopril (ZESTRIL) 10 MG tablet Take 1 tablet (10 mg total) by mouth daily.   magnesium oxide (MAG-OX) 400 (240 Mg) MG tablet Take 400 mg by mouth every evening.   Multiple Vitamin (MULTIVITAMIN WITH MINERALS) TABS tablet Take 1 tablet by mouth daily. [facility] Signature Adult 50+ Mature Multi Vitamins & Minerals   rosuvastatin (CRESTOR) 10 MG tablet Take 10 mg by mouth every evening.   TURMERIC-GINGER PO Take 1 tablet by mouth in the morning and at bedtime.     Allergies:  Patient has no known allergies.   Social History   Socioeconomic History   Marital status: Single    Spouse name: Not on file   Number of children: 0   Years of education: Not on file   Highest education level: Not on file  Occupational History   Occupation: Chief Financial Officer    Comment: retired  Tobacco Use   Smoking status: Never   Smokeless tobacco: Never  Vaping Use   Vaping Use: Never used  Substance and Sexual Activity   Alcohol use: No    Alcohol/week: 0.0 standard drinks of alcohol   Drug use: No   Sexual activity: Yes  Other Topics Concern   Not on file  Social History Narrative   Not on file   Social Determinants of Health   Financial  Resource Strain: Not on file  Food Insecurity: Not on file  Transportation Needs: Not on file  Physical Activity: Not on file  Stress: Not on file  Social Connections: Not on file     Family History: The patient's family history includes Cancer in his mother; Deep vein thrombosis in his sister; Lymphoma in his maternal grandmother.  ROS:   Please see the history of present illness.    All other systems reviewed and are negative.  EKGs/Labs/Other Studies Reviewed:     TTE 11/2019 EF 50-55%, left atrium is moderately dilated  EKG:  Last EKG results: Today - AF with controlled rate   Recent Labs: 02/22/2022: ALT 11; Magnesium 2.4; TSH 2.500 03/24/2022: BUN 15; Creatinine, Ser 0.90; Hemoglobin 15.7; Platelets 210; Potassium 5.2; Sodium 140    Risk Assessment/Calculations:    CHA2DS2-VASc Score = 2   This indicates a 2.2% annual risk of stroke. The patient's score is based upon: CHF History: 0 HTN History: 1 Diabetes History: 0 Stroke History: 0 Vascular Disease History: 0 Age Score: 1 Gender Score: 0         Physical Exam:    VS:  BP 120/80   Pulse 87   Ht 5' 10.5" (1.791 m)   Wt 186 lb (84.4 kg)   SpO2 97%   BMI 26.31 kg/m     Wt Readings from Last 3 Encounters:  05/03/22 186 lb (84.4 kg)  03/29/22 179 lb (81.2 kg)  03/21/22 184 lb (83.5 kg)     GEN:  Well nourished, well developed in no acute distress CARDIAC: Irregular, no murmurs, rubs, gallops RESPIRATORY:  Normal work of breathing MUSCULOSKELETAL: no edema    ASSESSMENT & PLAN:    Persistent atrial fibrillation: symptomatic despite rate control. We discussed management options. We discussed the indication, rationale, logistics, anticipated benefits, and potential risks of the ablation procedure including but not limited to -- bleed at the groin access site, chest pain, damage to nearby organs such as the diaphragm, lungs, or esophagus, need for a drainage tube, or prolonged hospitalization. I  explained that the risk for stroke, heart attack, need for open chest surgery, or even death is very low but not zero. He expressed understanding and wishes to proceed.  Longterm use of anticoagulation: CHADS2VASC is 2. Continue eliquis. Cardiomyopathy: recovered Frequent PVCs MVP s/p repair Suspected OSA: he has an app that monitors snoring and has had some correlation with recurrence of AF. Will order home sleep study.     Medication Adjustments/Labs and Tests Ordered: Current medicines are reviewed at length with the patient today.  Concerns regarding medicines are outlined above.  Orders Placed This Encounter  Procedures   CT  CARDIAC MORPH/PULM VEIN W/CM&W/O CA SCORE   CBC with Differential/Platelet   Basic metabolic panel   EKG 15-[facility]   Itamar Sleep Study   No orders of the defined types were placed in this encounter.    Signed, Jordan Quitter, MD  05/03/2022 9:35 AM    [place]

## 2022-05-03 ENCOUNTER — Encounter: Payer: Self-pay | Admitting: Cardiovascular Disease

## 2022-05-03 ENCOUNTER — Telehealth: Payer: Self-pay

## 2022-05-03 ENCOUNTER — Ambulatory Visit: Payer: Medicare Other | Attending: Cardiovascular Disease | Admitting: Cardiovascular Disease

## 2022-05-03 VITALS — BP 120/80 | HR 87 | Ht 70.5 in | Wt 186.0 lb

## 2022-05-03 DIAGNOSIS — I4819 Other persistent atrial fibrillation: Secondary | ICD-10-CM | POA: Diagnosis present

## 2022-05-03 NOTE — Progress Notes (Signed)
Patient given Itamar Sleep study device. App downloaded on patient's phone. Reviewed instructions. Waiver signed. Patient aware that we will call once we receive prior auth to let he know if/when he can proceed with study. Device registered to [employer].

## 2022-05-03 NOTE — Patient Instructions (Signed)
Medication Instructions:  Your physician recommends that you continue on your current medications as directed. Please refer to the Current Medication list given to you today.  *If you need a refill on your cardiac medications before your next appointment, please call your pharmacy*  Lab Work: TODAY: BMET and CBC If you have labs (blood work) drawn today and your tests are completely normal, you will receive your results only by: [place] (if you have MyChart) OR A paper copy in the mail If you have any lab test that is abnormal or we need to change your treatment, we will call you to review the results.  Testing/Procedures: Your physician has recommended that you have a sleep study. This test records several body functions during sleep, including: brain activity, eye movement, oxygen and carbon dioxide blood levels, heart rate and rhythm, breathing rate and rhythm, the flow of air through your mouth and nose, snoring, body muscle movements, and chest and belly movement.  Your physician has requested that you have cardiac CT. Cardiac computed tomography (CT) is a painless test that uses an x-ray machine to take clear, detailed pictures of your heart. For further information please visit [facility]. Please follow instruction sheet as given.  Your physician has recommended that you have an ablation. Catheter ablation is a medical procedure used to treat some cardiac arrhythmias (irregular heartbeats). During catheter ablation, a long, thin, flexible tube is put into a blood vessel in your groin (upper thigh), or neck. This tube is called an ablation catheter. It is then guided to your heart through the blood vessel. Radio frequency waves destroy small areas of heart tissue where abnormal heartbeats may cause an arrhythmia to start. Please see the instruction sheet given to you today.  Follow-Up: At [facility], you and your health needs are our priority.  As part of our  continuing mission to provide you with exceptional heart care, we have created designated Provider Care Teams.  These Care Teams include your primary Cardiologist (physician) and Advanced Practice Providers (APPs -  Physician Assistants and Nurse Practitioners) who all work together to provide you with the care you need, when you need it.  Your next appointment:   See instruction letter  Important Information About Sugar

## 2022-05-03 NOTE — Telephone Encounter (Signed)
Patient given Itamar Sleep study device. App downloaded on patient's phone. Reviewed instructions. Waiver signed. Patient aware that we will call once we receive prior auth to let he know if/when he can proceed with study. Device registered to [employer].

## 2022-05-04 LAB — CBC WITH DIFFERENTIAL/PLATELET
Basophils Absolute: 0 10*3/uL (ref 0.0–0.2)
Basos: 1 %
EOS (ABSOLUTE): 0.2 10*3/uL (ref 0.0–0.4)
Eos: 3 %
Hematocrit: 45.5 % (ref 37.5–51.0)
Hemoglobin: 15.6 g/dL (ref 13.0–17.7)
Immature Grans (Abs): 0 10*3/uL (ref 0.0–0.1)
Immature Granulocytes: 0 %
Lymphocytes Absolute: 1.4 10*3/uL (ref 0.7–3.1)
Lymphs: 22 %
MCH: 32.2 pg (ref 26.6–33.0)
MCHC: 34.3 g/dL (ref 31.5–35.7)
MCV: 94 fL (ref 79–97)
Monocytes Absolute: 0.6 10*3/uL (ref 0.1–0.9)
Monocytes: 9 %
Neutrophils Absolute: 4.1 10*3/uL (ref 1.4–7.0)
Neutrophils: 65 %
Platelets: 256 10*3/uL (ref 150–450)
RBC: 4.84 x10E6/uL (ref 4.14–5.80)
RDW: 11.9 % (ref 11.6–15.4)
WBC: 6.3 10*3/uL (ref 3.4–10.8)

## 2022-05-04 LAB — BASIC METABOLIC PANEL
BUN/Creatinine Ratio: 19 (ref 10–24)
BUN: 17 mg/dL (ref 8–27)
CO2: 25 mmol/L (ref 20–29)
Calcium: 10 mg/dL (ref 8.6–10.2)
Chloride: 104 mmol/L (ref 96–106)
Creatinine, Ser: 0.88 mg/dL (ref 0.76–1.27)
Glucose: 84 mg/dL (ref 70–99)
Potassium: 4.7 mmol/L (ref 3.5–5.2)
Sodium: 142 mmol/L (ref 134–144)
eGFR: 95 mL/min/{1.73_m2} (ref >59)

## 2022-05-06 ENCOUNTER — Ambulatory Visit: Payer: Medicare Other | Attending: Nurse Practitioner | Admitting: Nurse Practitioner

## 2022-05-06 ENCOUNTER — Encounter: Payer: Self-pay | Admitting: Nurse Practitioner

## 2022-05-06 VITALS — BP 102/66 | HR 86 | Ht 70.5 in | Wt 185.8 lb

## 2022-05-06 DIAGNOSIS — I420 Dilated cardiomyopathy: Secondary | ICD-10-CM | POA: Diagnosis present

## 2022-05-06 DIAGNOSIS — R0789 Other chest pain: Secondary | ICD-10-CM

## 2022-05-06 DIAGNOSIS — Z9889 Other specified postprocedural states: Secondary | ICD-10-CM | POA: Diagnosis not present

## 2022-05-06 DIAGNOSIS — I4819 Other persistent atrial fibrillation: Secondary | ICD-10-CM

## 2022-05-06 DIAGNOSIS — D6869 Other thrombophilia: Secondary | ICD-10-CM | POA: Insufficient documentation

## 2022-05-06 DIAGNOSIS — I10 Essential (primary) hypertension: Secondary | ICD-10-CM | POA: Diagnosis not present

## 2022-05-06 NOTE — Patient Instructions (Signed)
Medication Instructions:  Your physician recommends that you continue on your current medications as directed. Please refer to the Current Medication list given to you today.  *If you need a refill on your cardiac medications before your next appointment, please call your pharmacy*  Lab Work: NONE  Testing/Procedures: NONE  Follow-Up: Keep all upcoming appointments as scheduled.  Other Instructions Atrial Fibrillation and Exercise Even though you have atrial fibrillation, your provider still recommends that you participate in 150 minutes of moderate-to-intense exercise every week.  Some examples are swimming, dancing, yoga, golf, tennis, elliptical, walking, running, gardening and cycling.  You should also try to participate in muscle-strengthening activity, such as resistance bands or weightlifting, at least 2 days per week.  The best form of exercise is the one you will keep doing because you enjoy it!!  Exercise makes your heart stronger and can even prevent atrial fibrillation.  Hydration is important.  Drink water throughout the day and especially when you are sweating.  Take your medications regularly and follow instructions regarding food intake and timing.  Your heart rate may speed up or feel more irregular with exercise.  Do not be alarmed unless you have chest pain, difficulty breathing, or feel faint.    Listen to your body!  If you do not feel "right", stop and rest.  If your symptoms do not improve, seek emergency medical attention.  The following chart helps you to know how hard you should be working:     Important Information About Sugar

## 2022-05-13 ENCOUNTER — Telehealth (HOSPITAL_COMMUNITY): Payer: Self-pay | Admitting: *Deleted

## 2022-05-13 ENCOUNTER — Encounter (HOSPITAL_COMMUNITY): Payer: Self-pay

## 2022-05-13 NOTE — Telephone Encounter (Signed)
Reaching out to patient to offer assistance regarding upcoming cardiac imaging study; pt verbalizes understanding of appt date/time, parking situation and where to check in, and verified current allergies; name and call back number provided for further questions should they arise  Gordy Clement RN Navigator Cardiac Imaging [facility] [phone] office [phone] cell  Patient to take 12.'5mg'$  carvedilol two hours prior to his cardiac CT scan and hold his lisinopril.

## 2022-05-17 ENCOUNTER — Ambulatory Visit
Admission: RE | Admit: 2022-05-17 | Discharge: 2022-05-17 | Disposition: A | Discharge status: Home / Self Care | Payer: Medicare Other | Source: Ambulatory Visit | Attending: Cardiovascular Disease | Admitting: Cardiovascular Disease

## 2022-05-17 DIAGNOSIS — I4819 Other persistent atrial fibrillation: Secondary | ICD-10-CM | POA: Diagnosis present

## 2022-05-17 MED ORDER — IOHEXOL 350 MG/ML SOLN
75.0000 mL | Freq: Once | INTRAVENOUS | Status: AC | PRN
  Administered 2022-05-17: 75 mL via INTRAVENOUS

## 2022-05-20 NOTE — Pre-Procedure Instructions (Signed)
Instructed patient on the following items: Arrival time 1130 Nothing to eat or drink after midnight No meds AM of procedure Responsible person to drive you home and stay with you for 24 hrs  Have you missed any doses of anti-coagulant Elqiuis- hasn't missed any doses

## 2022-05-23 ENCOUNTER — Ambulatory Visit (HOSPITAL_COMMUNITY): Payer: Medicare Other | Admitting: Anesthesiology

## 2022-05-23 ENCOUNTER — Encounter (HOSPITAL_COMMUNITY): Payer: Self-pay | Admitting: Cardiovascular Disease

## 2022-05-23 ENCOUNTER — Ambulatory Visit (HOSPITAL_COMMUNITY)
Admission: RE | Disposition: A | Discharge status: Home / Self Care | Payer: Self-pay | Source: Home / Self Care | Attending: Cardiovascular Disease

## 2022-05-23 ENCOUNTER — Ambulatory Visit (HOSPITAL_BASED_OUTPATIENT_CLINIC_OR_DEPARTMENT_OTHER): Payer: Medicare Other | Admitting: Anesthesiology

## 2022-05-23 ENCOUNTER — Ambulatory Visit (HOSPITAL_COMMUNITY)
Admission: RE | Admit: 2022-05-23 | Discharge: 2022-05-24 | Disposition: A | Discharge status: Home / Self Care | Payer: Medicare Other | Attending: Cardiovascular Disease | Admitting: Cardiovascular Disease

## 2022-05-23 ENCOUNTER — Other Ambulatory Visit: Payer: Self-pay

## 2022-05-23 DIAGNOSIS — Z7901 Long term (current) use of anticoagulants: Secondary | ICD-10-CM | POA: Insufficient documentation

## 2022-05-23 DIAGNOSIS — I4891 Unspecified atrial fibrillation: Secondary | ICD-10-CM | POA: Diagnosis present

## 2022-05-23 DIAGNOSIS — I4819 Other persistent atrial fibrillation: Secondary | ICD-10-CM

## 2022-05-23 DIAGNOSIS — R0683 Snoring: Secondary | ICD-10-CM | POA: Diagnosis not present

## 2022-05-23 DIAGNOSIS — I493 Ventricular premature depolarization: Secondary | ICD-10-CM | POA: Insufficient documentation

## 2022-05-23 DIAGNOSIS — I10 Essential (primary) hypertension: Secondary | ICD-10-CM | POA: Insufficient documentation

## 2022-05-23 DIAGNOSIS — I480 Paroxysmal atrial fibrillation: Secondary | ICD-10-CM | POA: Diagnosis present

## 2022-05-23 HISTORY — PX: ATRIAL FIBRILLATION ABLATION: EP1191

## 2022-05-23 LAB — POCT ACTIVATED CLOTTING TIME
Activated Clotting Time: 287 seconds
Activated Clotting Time: 335 seconds
Activated Clotting Time: 275 seconds

## 2022-05-23 SURGERY — ATRIAL FIBRILLATION ABLATION
Anesthesia: General

## 2022-05-23 MED ORDER — ACETAMINOPHEN 500 MG PO TABS: 1000.0000 mg | ORAL_TABLET | Freq: Every day | ORAL | Status: DC | PRN

## 2022-05-23 MED ORDER — AMIODARONE HCL 150 MG/3ML IV SOLN
INTRAVENOUS | Status: AC
  Filled 2022-05-23: qty 3

## 2022-05-23 MED ORDER — ACETAMINOPHEN 325 MG PO TABS: 650.0000 mg | ORAL_TABLET | ORAL | Status: DC | PRN

## 2022-05-23 MED ORDER — SODIUM CHLORIDE 0.9 % IV SOLN: INTRAVENOUS | Status: DC

## 2022-05-23 MED ORDER — FENTANYL CITRATE (PF) 100 MCG/2ML IJ SOLN
INTRAMUSCULAR | Status: DC | PRN
  Administered 2022-05-23 (×2): 50 ug via INTRAVENOUS

## 2022-05-23 MED ORDER — ROSUVASTATIN CALCIUM 5 MG PO TABS
10.0000 mg | ORAL_TABLET | Freq: Every evening | ORAL | Status: DC
  Administered 2022-05-23: 10 mg via ORAL
  Filled 2022-05-23: qty 2

## 2022-05-23 MED ORDER — DOBUTAMINE INFUSION FOR EP/ECHO/NUC (1000 MCG/ML)
INTRAVENOUS | Status: DC | PRN
  Administered 2022-05-23: 20 ug/kg/min via INTRAVENOUS

## 2022-05-23 MED ORDER — LIDOCAINE 2% (20 MG/ML) 5 ML SYRINGE
INTRAMUSCULAR | Status: DC | PRN
  Administered 2022-05-23: 60 mg via INTRAVENOUS

## 2022-05-23 MED ORDER — HEPARIN (PORCINE) IN NACL 1000-0.9 UT/500ML-% IV SOLN
INTRAVENOUS | Status: AC
  Filled 2022-05-23: qty 2000

## 2022-05-23 MED ORDER — DOBUTAMINE INFUSION FOR EP/ECHO/NUC (1000 MCG/ML)
INTRAVENOUS | Status: AC
  Filled 2022-05-23: qty 250

## 2022-05-23 MED ORDER — SODIUM CHLORIDE 0.9% FLUSH: 3.0000 mL | INTRAVENOUS | Status: DC | PRN

## 2022-05-23 MED ORDER — ONDANSETRON HCL 4 MG/2ML IJ SOLN
INTRAMUSCULAR | Status: DC | PRN
  Administered 2022-05-23: 4 mg via INTRAVENOUS

## 2022-05-23 MED ORDER — ACETAMINOPHEN 500 MG PO TABS
1000.0000 mg | ORAL_TABLET | Freq: Once | ORAL | Status: AC
  Administered 2022-05-23: 1000 mg via ORAL
  Filled 2022-05-23: qty 2

## 2022-05-23 MED ORDER — DEXAMETHASONE SODIUM PHOSPHATE 4 MG/ML IJ SOLN
INTRAMUSCULAR | Status: DC | PRN
  Administered 2022-05-23: 10 mg via INTRAVENOUS

## 2022-05-23 MED ORDER — HEPARIN SODIUM (PORCINE) 1000 UNIT/ML IJ SOLN
INTRAMUSCULAR | Status: AC
  Filled 2022-05-23: qty 10

## 2022-05-23 MED ORDER — PHENYLEPHRINE 80 MCG/ML (10ML) SYRINGE FOR IV PUSH (FOR BLOOD PRESSURE SUPPORT)
PREFILLED_SYRINGE | INTRAVENOUS | Status: DC | PRN
  Administered 2022-05-23 (×2): 160 ug via INTRAVENOUS
  Administered 2022-05-23: 240 ug via INTRAVENOUS
  Administered 2022-05-23 (×7): 160 ug via INTRAVENOUS

## 2022-05-23 MED ORDER — PHENYLEPHRINE HCL-NACL 20-0.9 MG/250ML-% IV SOLN
INTRAVENOUS | Status: DC | PRN
  Administered 2022-05-23: 20 ug/min via INTRAVENOUS

## 2022-05-23 MED ORDER — MAGNESIUM OXIDE -MG SUPPLEMENT 400 (240 MG) MG PO TABS
400.0000 mg | ORAL_TABLET | Freq: Every evening | ORAL | Status: DC
  Administered 2022-05-23: 400 mg via ORAL
  Filled 2022-05-23: qty 1

## 2022-05-23 MED ORDER — ONDANSETRON HCL 4 MG/2ML IJ SOLN: 4.0000 mg | Freq: Four times a day (QID) | INTRAMUSCULAR | Status: DC | PRN

## 2022-05-23 MED ORDER — PROPOFOL 10 MG/ML IV BOLUS
INTRAVENOUS | Status: DC | PRN
  Administered 2022-05-23: 140 mg via INTRAVENOUS

## 2022-05-23 MED ORDER — VITAMIN D 25 MCG (1000 UNIT) PO TABS
5000.0000 [IU] | ORAL_TABLET | Freq: Every morning | ORAL | Status: DC
  Administered 2022-05-24: 5000 [IU] via ORAL
  Filled 2022-05-23: qty 5

## 2022-05-23 MED ORDER — ROCURONIUM BROMIDE 10 MG/ML (PF) SYRINGE
PREFILLED_SYRINGE | INTRAVENOUS | Status: DC | PRN
  Administered 2022-05-23: 50 mg via INTRAVENOUS
  Administered 2022-05-23: 20 mg via INTRAVENOUS

## 2022-05-23 MED ORDER — HEPARIN SODIUM (PORCINE) 1000 UNIT/ML IJ SOLN
INTRAMUSCULAR | Status: DC | PRN
  Administered 2022-05-23: 1000 [IU] via INTRAVENOUS

## 2022-05-23 MED ORDER — HEPARIN SODIUM (PORCINE) 1000 UNIT/ML IJ SOLN
INTRAMUSCULAR | Status: DC | PRN
  Administered 2022-05-23 (×2): 2000 [IU] via INTRAVENOUS
  Administered 2022-05-23: 15000 [IU] via INTRAVENOUS

## 2022-05-23 MED ORDER — HEPARIN (PORCINE) IN NACL 1000-0.9 UT/500ML-% IV SOLN
INTRAVENOUS | Status: DC | PRN
  Administered 2022-05-23 (×4): 500 mL

## 2022-05-23 MED ORDER — CARVEDILOL 12.5 MG PO TABS
12.5000 mg | ORAL_TABLET | Freq: Two times a day (BID) | ORAL | Status: DC
  Administered 2022-05-24: 12.5 mg via ORAL
  Filled 2022-05-23: qty 1

## 2022-05-23 MED ORDER — LISINOPRIL 10 MG PO TABS
10.0000 mg | ORAL_TABLET | Freq: Every day | ORAL | Status: DC
  Administered 2022-05-24: 10 mg via ORAL
  Filled 2022-05-23: qty 1

## 2022-05-23 MED ORDER — SUGAMMADEX SODIUM 200 MG/2ML IV SOLN
INTRAVENOUS | Status: DC | PRN
  Administered 2022-05-23: 200 mg via INTRAVENOUS

## 2022-05-23 MED ORDER — APIXABAN 5 MG PO TABS
5.0000 mg | ORAL_TABLET | Freq: Two times a day (BID) | ORAL | Status: DC
  Administered 2022-05-23 – 2022-05-24 (×2): 5 mg via ORAL
  Filled 2022-05-23 (×2): qty 1

## 2022-05-23 MED ORDER — PROTAMINE SULFATE 10 MG/ML IV SOLN
INTRAVENOUS | Status: DC | PRN
  Administered 2022-05-23: 50 mg via INTRAVENOUS

## 2022-05-23 MED ORDER — AMIODARONE IV BOLUS ONLY 150 MG/100ML
INTRAVENOUS | Status: DC | PRN
  Administered 2022-05-23: 150 mg via INTRAVENOUS

## 2022-05-23 MED ORDER — SODIUM CHLORIDE 0.9 % IV SOLN: 250.0000 mL | INTRAVENOUS | Status: DC | PRN

## 2022-05-23 MED ORDER — SODIUM CHLORIDE 0.9% FLUSH
3.0000 mL | Freq: Two times a day (BID) | INTRAVENOUS | Status: DC
  Administered 2022-05-23: 3 mL via INTRAVENOUS

## 2022-05-23 SURGICAL SUPPLY — 19 items
CATH 8FR REPROCESSED SOUNDSTAR (CATHETERS) ×1 IMPLANT
CATH 8FR SOUNDSTAR REPROCESSED (CATHETERS) IMPLANT
CATH ABLAT QDOT MICRO BI TC FJ (CATHETERS) IMPLANT
CATH OCTARAY 2.0 F 3-3-3-3-3 (CATHETERS) IMPLANT
CATH PIGTAIL STEERABLE D1 8.7 (WIRE) IMPLANT
CATH S-M CIRCA TEMP PROBE (CATHETERS) IMPLANT
CATH WEBSTER BI DIR CS D-F CRV (CATHETERS) IMPLANT
CLOSURE PERCLOSE PROSTYLE (VASCULAR PRODUCTS) IMPLANT
COVER SWIFTLINK CONNECTOR (BAG) ×1 IMPLANT
DEVICE CLOSURE MYNXGRIP 6/7F (Vascular Products) IMPLANT
PACK EP LATEX FREE (CUSTOM PROCEDURE TRAY) ×1
PACK EP LF (CUSTOM PROCEDURE TRAY) ×1 IMPLANT
PAD DEFIB RADIO PHYSIO CONN (PAD) ×1 IMPLANT
PATCH CARTO3 (PAD) IMPLANT
SHEATH CARTO VIZIGO SM CVD (SHEATH) IMPLANT
SHEATH PINNACLE 8F 10CM (SHEATH) IMPLANT
SHEATH PINNACLE 9F 10CM (SHEATH) IMPLANT
SHEATH PROBE COVER 6X72 (BAG) IMPLANT
TUBING SMART ABLATE COOLFLOW (TUBING) IMPLANT

## 2022-05-23 NOTE — Anesthesia Postprocedure Evaluation (Signed)
Anesthesia Post Note  Patient: Jordan Payne  Procedure(s) Performed: ATRIAL FIBRILLATION ABLATION     Patient location during evaluation: PACU Anesthesia Type: General Level of consciousness: awake Pain management: pain level controlled Vital Signs Assessment: post-procedure vital signs reviewed and stable Respiratory status: spontaneous breathing Cardiovascular status: stable Postop Assessment: no apparent nausea or vomiting Anesthetic complications: no   There were no known notable events for this encounter.  Last Vitals:  Vitals:   05/23/22 1701 05/23/22 1721  BP: 101/66 96/75  Pulse: 76 75  Resp: 12 18  Temp: 36.7 C 37.1 C  SpO2: 95% 96%    Last Pain:  Vitals:   05/23/22 1721  TempSrc: Oral  PainSc:                  Jordan Payne

## 2022-05-23 NOTE — Transfer of Care (Signed)
Immediate Anesthesia Transfer of Care Note  Patient: Tige Meas  Procedure(s) Performed: ATRIAL FIBRILLATION ABLATION  Patient Location: PACU and Cath Lab  Anesthesia Type:General  Level of Consciousness: awake, alert , and patient cooperative  Airway & Oxygen Therapy: Patient Spontanous Breathing  Post-op Assessment: Report given to RN and Post -op Vital signs reviewed and stable  Post vital signs: Reviewed and stable  Last Vitals:  Vitals Value Taken Time  BP 109/70 05/23/22 1633  Temp    Pulse 78 05/23/22 1635  Resp 15 05/23/22 1635  SpO2 95 % 05/23/22 1635  Vitals shown include unvalidated device data.  Last Pain:  Vitals:   05/23/22 1631  TempSrc: Temporal  PainSc: 0-No pain         Complications: There were no known notable events for this encounter.

## 2022-05-23 NOTE — Anesthesia Procedure Notes (Signed)
Procedure Name: Intubation Date/Time: 05/23/2022 1:52 PM  Performed by: Eulas Post, Domani Bakos W, CRNAPre-anesthesia Checklist: Patient identified, Emergency Drugs available, Suction available and Patient being monitored Patient Re-evaluated:Patient Re-evaluated prior to induction Oxygen Delivery Method: Circle system utilized Preoxygenation: Pre-oxygenation with 100% oxygen Induction Type: IV induction Ventilation: Mask ventilation without difficulty Laryngoscope Size: Miller and 2 Grade View: Grade II Tube type: Oral Tube size: 8.0 mm Number of attempts: 2 Airway Equipment and Method: Stylet Placement Confirmation: ETT inserted through vocal cords under direct vision, positive ETCO2 and breath sounds checked- equal and bilateral Secured at: 23 cm Tube secured with: Tape Dental Injury: Teeth and Oropharynx as per pre-operative assessment  Comments: DL X 1, ACarter, no view. RF DL X1, intubates grade two view. +eTCO2, =BBS.

## 2022-05-23 NOTE — Progress Notes (Signed)
Patient states he had two bags of belongings upon arrival. Belongings did not make it to the room with patient. Contacted cath lab, states they will look for belongings and bring to the room.

## 2022-05-23 NOTE — Interval H&P Note (Signed)
History and Physical Interval Note:  05/23/2022 1:35 PM  Jordan Payne  has presented today for surgery, with the diagnosis of afib.  The various methods of treatment have been discussed with the patient and family. After consideration of risks, benefits and other options for treatment, the patient has consented to  Procedure(s): ATRIAL FIBRILLATION ABLATION (N/A) as a surgical intervention.  The patient's history has been reviewed, patient examined, no change in status, stable for surgery.  I have reviewed the patient's chart and labs.  Questions were answered to the patient's satisfaction.    I reviewed the patient's CT and labs. There was not LAA thrombus. he  has not missed any doses of anticoagulation, and he took his dose last night. There have been no changes in the patient's diagnoses, medications, or condition since our recent clinic visit.  l   Jordan Payne

## 2022-05-23 NOTE — Anesthesia Preprocedure Evaluation (Signed)
Anesthesia Evaluation  Patient identified by MRN, date of birth, ID band Patient awake    Reviewed: Allergy & Precautions, NPO status , Patient's Chart, lab work & pertinent test results  Airway Mallampati: II  TM Distance: >3 FB Neck ROM: Full    Dental  (+) Dental Advisory Given   Pulmonary neg pulmonary ROS   breath sounds clear to auscultation       Cardiovascular hypertension, Pt. on medications + dysrhythmias Atrial Fibrillation + Valvular Problems/Murmurs (s/p MV repair)  Rhythm:Irregular Rate:Normal     Neuro/Psych negative neurological ROS     GI/Hepatic negative GI ROS, Neg liver ROS,,,  Endo/Other  negative endocrine ROS    Renal/GU negative Renal ROS     Musculoskeletal  (+) Arthritis ,    Abdominal   Peds  Hematology negative hematology ROS (+)   Anesthesia Other Findings   Reproductive/Obstetrics                             Anesthesia Physical Anesthesia Plan  ASA: 2  Anesthesia Plan: General   Post-op Pain Management: Tylenol PO (pre-op)*   Induction: Intravenous  PONV Risk Score and Plan: 2 and Dexamethasone, Ondansetron and Treatment may vary due to age or medical condition  Airway Management Planned: Oral ETT  Additional Equipment: None  Intra-op Plan:   Post-operative Plan: Extubation in OR  Informed Consent: I have reviewed the patients History and Physical, chart, labs and discussed the procedure including the risks, benefits and alternatives for the proposed anesthesia with the patient or authorized representative who has indicated his/her understanding and acceptance.     Dental advisory given  Plan Discussed with: CRNA  Anesthesia Plan Comments:        Anesthesia Quick Evaluation

## 2022-05-24 ENCOUNTER — Encounter (HOSPITAL_COMMUNITY): Payer: Self-pay | Admitting: Cardiovascular Disease

## 2022-05-24 ENCOUNTER — Other Ambulatory Visit (HOSPITAL_COMMUNITY): Payer: Self-pay

## 2022-05-24 DIAGNOSIS — I4819 Other persistent atrial fibrillation: Secondary | ICD-10-CM | POA: Diagnosis not present

## 2022-05-24 DIAGNOSIS — I10 Essential (primary) hypertension: Secondary | ICD-10-CM | POA: Diagnosis not present

## 2022-05-24 DIAGNOSIS — I493 Ventricular premature depolarization: Secondary | ICD-10-CM | POA: Diagnosis not present

## 2022-05-24 DIAGNOSIS — Z7901 Long term (current) use of anticoagulants: Secondary | ICD-10-CM | POA: Diagnosis not present

## 2022-05-24 MED ORDER — COLCHICINE 0.6 MG PO TABS
0.6000 mg | ORAL_TABLET | Freq: Two times a day (BID) | ORAL | Status: DC
  Administered 2022-05-24: 0.6 mg via ORAL
  Filled 2022-05-24: qty 1

## 2022-05-24 MED ORDER — COLCHICINE 0.6 MG PO TABS
0.6000 mg | ORAL_TABLET | Freq: Two times a day (BID) | ORAL | 0 refills | Status: DC
  Filled 2022-05-24: qty 10, 5d supply, fill #0

## 2022-05-24 MED ORDER — PANTOPRAZOLE SODIUM 40 MG PO TBEC
40.0000 mg | DELAYED_RELEASE_TABLET | Freq: Every day | ORAL | Status: DC
  Administered 2022-05-24: 40 mg via ORAL
  Filled 2022-05-24: qty 1

## 2022-05-24 MED ORDER — PANTOPRAZOLE SODIUM 40 MG PO TBEC
40.0000 mg | DELAYED_RELEASE_TABLET | Freq: Every day | ORAL | 0 refills | Status: DC
  Filled 2022-05-24: qty 45, 45d supply, fill #0

## 2022-05-24 MED FILL — Amiodarone HCl Inj 150 MG/3ML (50 MG/ML): INTRAVENOUS | Qty: 6 | Status: AC

## 2022-05-24 NOTE — TOC Transition Note (Signed)
Transition of Care [facility]) - CM/SW Discharge Note   Patient Details  Name: Jordan Payne MRN: [id number] Date of Birth: 06/28/1956  [facility] CM/SW Contact:  Zenon Mayo, RN Phone Number: 05/24/2022, 9:39 AM   Clinical Narrative:    Patient is for dc today, TOC to fill meds. He has no needs.         Patient Goals and CMS Choice        Discharge Placement                       Discharge Plan and Services                                     Social Determinants of Health (SDOH) Interventions     Readmission Risk Interventions     No data to display

## 2022-05-24 NOTE — Discharge Instructions (Signed)

## 2022-05-24 NOTE — Progress Notes (Signed)
Explained discharge instructions to patient. Reviewed follow up appointment and next medication administration times. Also reviewed education. Patient verbalized having an understanding for instructions given. All belongings are in the patient's possession to include TOC meds. IV and telemetry were removed. CCMD was notified. No other needs verbalized. Transported downstairs for discharge. 

## 2022-05-24 NOTE — Discharge Summary (Signed)
ELECTROPHYSIOLOGY PROCEDURE DISCHARGE SUMMARY    Patient ID: Jordan Payne,  MRN: [id number], DOB/AGE: 1956/04/16 66 y.o.  Admit date: 05/23/2022 Discharge date: 05/24/2022  Primary Care Physician: Lawerance Cruel, MD  Primary Cardiologist: Fransico Him, MD  Electrophysiologist:  Dr. Myles Gip  Primary Discharge Diagnosis:  Persistent Atrial Fibrillation  Secondary Discharge Diagnosis:  PVCs Cardiomyopathy, recovered  Procedures This Admission:  1.  Electrophysiology study and radiofrequency catheter ablation of Atrial Fibrillation on 05/23/2022 by  Dr. Myles Gip .  This study demonstrated: I. Successful PVI Ii. Successful cardioversion to sinus rhythm Iii. No early apparent complications    Brief HPI: Jordan Payne is a 66 y.o. male with a history of Atrial Fibrillation. Risks, benefits, and alternatives to catheter ablation of Atrial Fibrillation were reviewed with the patient who wished to proceed.   The patient has been on uninterrupted anticoagulation for more than 3 weeks and did not require TEE.   Hospital Course:  The patient was admitted and underwent EPS/RFCA of Atrial Fibrillation with details as outlined above.  They were monitored on telemetry overnight which demonstrated NSR with 1 short burst of atrial arryhtmia.  Groin was without complication on the day of discharge.  The patient was examined and considered to be stable for discharge.  Wound care and restrictions were reviewed with the patient.  The patient will be seen back by Adline Peals, PA in 4 weeks and  Dr. Myles Gip  in 12 weeks for post ablation follow up.   CHA2DS2VASC is at least 2  Physical Exam: Vitals:   05/23/22 1701 05/23/22 1721 05/23/22 1937 05/24/22 0407  BP: 101/66 96/75 110/86 111/84  Pulse: 76 75 84 82  Resp: '12 18 18 18  '$ Temp: 98 F (36.7 C) 98.7 F (37.1 C) 98.5 F (36.9 C) (!) 97.4 F (36.3 C)  TempSrc:  Oral Oral Oral  SpO2: 95% 96% 95% 99%  Weight:    83.6 kg  Height:         GEN- The patient is well appearing, alert and oriented x 3 today.   HEENT: normocephalic, atraumatic; sclera clear, conjunctiva pink; hearing intact; oropharynx clear; neck supple  Lungs- Clear to ausculation bilaterally, normal work of breathing.  No wheezes, rales, rhonchi Heart- Regular rate and rhythm, no murmurs, rubs or gallops  GI- soft, non-tender, non-distended, bowel sounds present  Extremities- no clubbing, cyanosis, or edema; DP/PT/radial pulses 2+ bilaterally, groin without hematoma/bruit MS- no significant deformity or atrophy Skin- warm and dry, no rash or lesion Psych- euthymic mood, full affect Neuro- strength and sensation are intact   Labs:   Lab Results  Component Value Date   WBC 6.3 05/03/2022   HGB 15.6 05/03/2022   HCT 45.5 05/03/2022   MCV 94 05/03/2022   PLT 256 05/03/2022   No results for input(s): "NA", "K", "CL", "CO2", "BUN", "CREATININE", "CALCIUM", "PROT", "BILITOT", "ALKPHOS", "ALT", "AST", "GLUCOSE" in the last 168 hours.  Invalid input(s): "LABALBU"   Discharge Medications:  Allergies as of 05/24/2022   No Known Allergies      Medication List     TAKE these medications    acetaminophen 500 MG tablet Commonly known as: TYLENOL Take 1,000 mg by mouth daily as needed for moderate pain.   apixaban 5 MG Tabs tablet Commonly known as: ELIQUIS Take 1 tablet (5 mg total) by mouth 2 (two) times daily.   Calcium 600-D 600-10 MG-MCG Tabs Generic drug: Calcium Carb-Cholecalciferol Take 1 tablet by mouth 2 (two) times daily.  carvedilol 12.5 MG tablet Commonly known as: COREG Take 0.5 tablets (6.25 mg total) by mouth 2 (two) times daily with a meal. What changed: how much to take   cetirizine 10 MG tablet Commonly known as: ZYRTEC Take 10 mg by mouth in the morning.   Coenzyme Q10 300 MG Caps Take 300 mg by mouth daily.   colchicine 0.6 MG tablet Take 1 tablet (0.6 mg total) by mouth 2 (two) times daily for 5 days.    lisinopril 10 MG tablet Commonly known as: ZESTRIL Take 1 tablet (10 mg total) by mouth daily.   magnesium oxide 400 (240 Mg) MG tablet Commonly known as: MAG-OX Take 400 mg by mouth every evening.   multivitamin with minerals Tabs tablet Take 1 tablet by mouth daily. [facility] Signature Adult 50+ Mature Multi Vitamins & Minerals   pantoprazole 40 MG tablet Commonly known as: PROTONIX Take 1 tablet (40 mg total) by mouth daily.   rosuvastatin 10 MG tablet Commonly known as: CRESTOR Take 10 mg by mouth every evening.   TRIPLE FLEX BONE & JOINT PO Take 1 tablet by mouth in the morning. [facility] Signature Triple Action Joint Health   TURMERIC PO Take 1,000 mg by mouth daily.   Vitamin C/Rose Hips TR 1000 MG Tbcr Generic drug: Ascorbic Acid Take 1,000 mg by mouth in the morning.   Vitamin D3 125 MCG (5000 UT) Tabs Take 5,000 Units by mouth in the morning.        Disposition:    Follow-up Information     Fenton, Clint R, PA Follow up.   Specialty: Cardiology Why: on 12/11 at 130 for post ablation follow up Contact information: [place] [address] [phone]                 Duration of Discharge Encounter: Greater than 30 minutes including physician time.  Jacalyn Lefevre, [name]  05/24/2022 8:05 AM

## 2022-05-24 NOTE — Plan of Care (Signed)
  Problem: Education: Goal: Knowledge of General Education information will improve Description: Including pain rating scale, medication(s)/side effects and non-pharmacologic comfort measures Outcome: Adequate for Discharge   Problem: Health Behavior/Discharge Planning: Goal: Ability to manage health-related needs will improve Outcome: Adequate for Discharge   Problem: Clinical Measurements: Goal: Ability to maintain clinical measurements within normal limits will improve Outcome: Adequate for Discharge Goal: Will remain free from infection Outcome: Adequate for Discharge Goal: Diagnostic test results will improve Outcome: Adequate for Discharge Goal: Respiratory complications will improve Outcome: Adequate for Discharge Goal: Cardiovascular complication will be avoided Outcome: Adequate for Discharge   Problem: Activity: Goal: Risk for activity intolerance will decrease Outcome: Adequate for Discharge   Problem: Nutrition: Goal: Adequate nutrition will be maintained Outcome: Adequate for Discharge   Problem: Coping: Goal: Level of anxiety will decrease Outcome: Adequate for Discharge   Problem: Elimination: Goal: Will not experience complications related to bowel motility Outcome: Adequate for Discharge Goal: Will not experience complications related to urinary retention Outcome: Adequate for Discharge   Problem: Pain Managment: Goal: General experience of comfort will improve Outcome: Adequate for Discharge   Problem: Safety: Goal: Ability to remain free from injury will improve Outcome: Adequate for Discharge   Problem: Skin Integrity: Goal: Risk for impaired skin integrity will decrease Outcome: Adequate for Discharge   Problem: Education: Goal: Understanding of disease, treatment, and recovery process will improve Outcome: Adequate for Discharge   Problem: Activity: Goal: Ability to return to baseline activity level will improve Outcome: Adequate for  Discharge   Problem: Cardiac: Goal: Ability to maintain adequate cardiovascular perfusion will improve Outcome: Adequate for Discharge Goal: Vascular access site(s) Level 0-1 will be maintained Outcome: Adequate for Discharge   Problem: Health Behavior/ Discharge Planning: Goal: Ability to safely manage health related needs after discharge Outcome: Adequate for Discharge   

## 2022-05-25 ENCOUNTER — Telehealth: Payer: Self-pay | Admitting: Cardiovascular Disease

## 2022-05-25 NOTE — Telephone Encounter (Signed)
Pt c/o of Chest Pain: STAT if CP now or developed within 24 hours  1. Are you having CP right now? Yes   2. Are you experiencing any other symptoms (ex. SOB, nausea, vomiting, sweating)? No   3. How long have you been experiencing CP? For about 2 hours  4. Is your CP continuous or coming and going? Continuous   5. Have you taken Nitroglycerin? No, doesn't have any.  Patient states he had an ablation done two days ago, he took two Tylenol he states it possibly make have eased the pain some.  He took it at 2:22pm.  ?

## 2022-05-25 NOTE — Telephone Encounter (Signed)
Spoke with the patient who states that pain has improved since taking tylenol. I provided him with recommendations from Dr. Myles Gip. He verbalized understanding.

## 2022-05-25 NOTE — Telephone Encounter (Signed)
Prior Authorization for [facility] sent to MEDICARE via web portal. Tracking Number . NO PA REQ

## 2022-05-25 NOTE — Telephone Encounter (Signed)
I s/w the pt and he has been given PIN# [id number] and weill sleep study this week. Called and made the patient aware that he may proceed with the [facility] Sleep Study. PIN # provided to the patient. Patient made aware that he will be contacted after the test has been read with the results and any recommendations. Patient verbalized understanding and thanked me for the call.

## 2022-05-25 NOTE — Telephone Encounter (Signed)
Spoke with pt who reports dull left sided chest pain that started about 1pm today.  It is similar to the pain he had in his chest right after his ablation on Monday 11/13.  Pt denies additional symptoms of radiation of pain, SOB, dizziness, diaphoresis, N&V or edema.  He is eating and drinking without any problems.  Current BP 122/78 and HR 63.  He states pain is worse when lying on his back. He has taken 2 Tylenol at 220pm and has not had any improvement at this point.  He has been resting and working at his desk.  He does have to climb some steps in his home.  He rates the pain as 4 or 5 on the pain scale. Pt advised will forward to Dr Myles Gip and his nurse for review and further recommendation.  Reviewed ED precautions.  Pt verbalizes understanding and agrees with current plan.

## 2022-05-26 NOTE — Addendum Note (Signed)
Addendum  created 05/26/22 0825 by Belinda Block, MD   Attestation recorded in [place], Ali Chuk filed

## 2022-05-28 ENCOUNTER — Encounter (HOSPITAL_BASED_OUTPATIENT_CLINIC_OR_DEPARTMENT_OTHER): Payer: Medicare Other | Admitting: Cardiology

## 2022-05-28 DIAGNOSIS — G4733 Obstructive sleep apnea (adult) (pediatric): Secondary | ICD-10-CM

## 2022-05-29 NOTE — Procedures (Signed)
SLEEP STUDY REPORT Patient Information Study Date: 05/29/22 Patient Name: Jordan Payne Patient ID: [id number] Birth Date: [birth year] Age: 66 Gender: Male BMI: 26.5 (W=185 lb, H=5' 10'') Stopbang: 6 Referring Physician: Doralee Albino, MD  TEST DESCRIPTION: Home sleep apnea testing was completed using the WatchPat, a Type 1 device, utilizing peripheral arterial tonometry (PAT), chest movement, actigraphy, pulse oximetry, pulse rate, body position and snore.  AHI was calculated with apnea and hypopnea using valid sleep time as the denominator. RDI includes apneas, hypopneas, and RERAs.  The data acquired and the scoring of sleep and all associated events were performed in accordance with the recommended standards and specifications as outlined in the AASM Manual for the Scoring of Sleep and Associated Events 2.2.0 (2015).  FINDINGS:  1.  Severe Obstructive Sleep Apnea with AHI 32.1/hr.   2.  No Central Sleep Apnea with pAHIc 0.5/hr.  3.  Oxygen desaturations as low as 83%.  4.  Mild to moderate snoring was present. O2 sats were < 88% for 0.4 min.  5.  Total sleep time was 6 hrs and 22 min.  6.  26.5% of total sleep time was spent in REM sleep.   7.  Shortened sleep onset latency at 6 min.   8.  Shortened REM sleep onset latency at 69 min.   9.  Total awakenings were 10.  10. Arrhythmia detection:  Suggestive of possible brief atrial fibrillation lasting 1hr 43mn and 34sec.  This is not diagnostic and further testing with outpatient telemetry monitoring is recommended.  DIAGNOSIS:   Severe Obstructive Sleep Apnea (G47.33) Possible Atrial Fibrillation  RECOMMENDATIONS:   1.  Clinical correlation of these findings is necessary.  The decision to treat obstructive sleep apnea (OSA) is usually based on the presence of apnea symptoms or the presence of associated medical conditions such as Hypertension, Congestive Heart Failure, Atrial Fibrillation or Obesity.  The most common  symptoms of OSA are snoring, gasping for breath while sleeping, daytime sleepiness and fatigue.   2.  Initiating apnea therapy is recommended given the presence of symptoms and/or associated conditions. Recommend proceeding with one of the following:     a.  Auto-CPAP therapy with a pressure range of 5-20cm H2O.     b.  An oral appliance (OA) that can be obtained from certain dentists with expertise in sleep medicine.  These are primarily of use in non-obese patients with mild and moderate disease.     c.  An ENT consultation which may be useful to look for specific causes of obstruction and possible treatment options.     d.  If patient is intolerant to PAP therapy, consider referral to ENT for evaluation for hypoglossal nerve stimulator.   3.  Close follow-up is necessary to ensure success with CPAP or oral appliance therapy for maximum benefit.  4.  A follow-up oximetry study on CPAP is recommended to assess the adequacy of therapy and determine the need for supplemental oxygen or the potential need for Bi-level therapy.  An arterial blood gas to determine the adequacy of baseline ventilation and oxygenation should also be considered.  5.  Healthy sleep recommendations include:  adequate nightly sleep (normal 7-9 hrs/night), avoidance of caffeine after noon and alcohol near bedtime, and maintaining a sleep environment that is cool, dark and quiet.  6.  Weight loss for overweight patients is recommended.  Even modest amounts of weight loss can significantly improve the severity of sleep apnea.  7.  Snoring  recommendations include:  weight loss where appropriate, side sleeping, and avoidance of alcohol before bed.  8.  Operation of motor vehicle should be avoided when sleepy.  9.  Consider outpatient event monitor to assess for silent atrial arrhythmias if clincially indicated.  Signature:   Fransico Him, MD; [facility]; [place], [place] Board of Sleep Medicine Electronically Signed:  05/29/2022

## 2022-05-30 ENCOUNTER — Ambulatory Visit: Payer: Medicare Other | Attending: Cardiovascular Disease

## 2022-05-30 DIAGNOSIS — I4819 Other persistent atrial fibrillation: Secondary | ICD-10-CM

## 2022-06-07 ENCOUNTER — Telehealth: Payer: Self-pay | Admitting: *Deleted

## 2022-06-07 DIAGNOSIS — I10 Essential (primary) hypertension: Secondary | ICD-10-CM

## 2022-06-07 DIAGNOSIS — G4733 Obstructive sleep apnea (adult) (pediatric): Secondary | ICD-10-CM

## 2022-06-07 DIAGNOSIS — G4719 Other hypersomnia: Secondary | ICD-10-CM

## 2022-06-07 NOTE — Telephone Encounter (Signed)
The patient has been notified of the result. Left detailed message on voicemail and informed patient to call back..Deane Wattenbarger Green, CMA   

## 2022-06-07 NOTE — Telephone Encounter (Signed)
-----   Message from Lauralee Evener, [place] sent at 05/30/2022  8:56 AM EST -----  ----- Message ----- From: Sueanne Margarita, MD Sent: 05/29/2022   5:37 PM EST To: [facility] Sleep Studies  Please let patient know that they have sleep apnea.  Recommend therapeutic CPAP titration for treatment of patient's sleep disordered breathing.  If unable to perform an in lab titration then initiate ResMed auto CPAP from 4 to 15cm H2O with heated humidity and mask of choice and overnight pulse ox on CPAP.

## 2022-06-08 NOTE — Telephone Encounter (Signed)
The patient has been notified of the result and verbalized understanding.  All questions (if any) were answered. Marolyn Hammock, [place] 06/08/2022 12:35 PM    Will send to be scheduled

## 2022-06-08 NOTE — Addendum Note (Signed)
Addended by: Freada Bergeron on: 06/08/2022 12:35 PM   Modules accepted: Orders

## 2022-06-20 ENCOUNTER — Encounter (HOSPITAL_COMMUNITY): Payer: Self-pay

## 2022-06-20 ENCOUNTER — Ambulatory Visit (HOSPITAL_COMMUNITY): Payer: Medicare Other | Admitting: Physician Assistant

## 2022-06-20 NOTE — Telephone Encounter (Signed)
Patient had to cancel his Ablation follow up today. He tested for Covid today and his test was positive. He is having some chest discomfort which per Audry Pili is more than likely due to the [place]. Symptoms feeling fatigue, low grade fever and headache. B/P- 116/93, HR 99. He had questions regarding antiviral medications. Conuslted with Adline Peals -PA he can only take Molnupiravir medication. He will reach out to his pcp. He is rescheduled for his appointment on 12/28 @ 2:30pm to see Roderic Palau NP. Communicated with patient and he verbalized understanding. This encounter was created in error - please disregard.

## 2022-06-20 NOTE — Telephone Encounter (Deleted)
Patient had to cancel his Ablation follow up today. He tested for Covid today and his test was positive. He is having some chest discomfort, fatigue, low grade fever and some headaches. B/P- 116/93, HR 99. He had questions regarding antiviral medications. Conuslted with Adline Peals -PA he can only take Molnupiravir medication. He will reach out to his pcp. He is rescheduled for his appointmetn on 12/28 @

## 2022-06-27 ENCOUNTER — Institutional Professional Consult (permissible substitution): Payer: Medicare Other | Admitting: Pulmonary Disease

## 2022-06-30 ENCOUNTER — Encounter (HOSPITAL_BASED_OUTPATIENT_CLINIC_OR_DEPARTMENT_OTHER): Payer: Medicare Other | Admitting: Cardiology

## 2022-07-07 ENCOUNTER — Ambulatory Visit (HOSPITAL_COMMUNITY)
Admission: RE | Admit: 2022-07-07 | Discharge: 2022-07-07 | Disposition: A | Discharge status: Home / Self Care | Payer: Medicare Other | Source: Ambulatory Visit | Attending: Physician Assistant | Admitting: Physician Assistant

## 2022-07-07 ENCOUNTER — Encounter (HOSPITAL_COMMUNITY): Payer: Self-pay | Admitting: Nurse Practitioner

## 2022-07-07 VITALS — BP 132/86 | HR 71 | Ht 70.5 in | Wt 179.4 lb

## 2022-07-07 DIAGNOSIS — I4819 Other persistent atrial fibrillation: Secondary | ICD-10-CM | POA: Insufficient documentation

## 2022-07-07 NOTE — Progress Notes (Signed)
Primary Care Physician: Lawerance Cruel, MD Referring Physician: Dr. Rubin Payor Jordan Payne is a 66 y.o. male with a h/o afib, htn, DCM, that is in the afib clinic for f/u afib ablation, He also tested positive for covid a couple of weeks after ablation. He has now recovered but did notice some irregular heart beat for a few weeks after . Since the 21st of December, his heart rhythm has been stable. No  swallowing or groin issues.he is back to his walking routine.   Today, he denies symptoms of palpitations, chest pain, shortness of breath, orthopnea, PND, lower extremity edema, dizziness, presyncope, syncope, or neurologic sequela. The patient is tolerating medications without difficulties and is otherwise without complaint today.   Past Medical History:  Diagnosis Date   Arthritis    DCM (dilated cardiomyopathy) ([place])    EF 40-45% by echo 2015.  EF 55% by echo 2017. EF 50-55% by echo 2021   Hypertension    MVP (mitral valve prolapse)    s/p repair   Prostate cancer ([place])    PVC's (premature ventricular contractions)    Tibialis posterior rupture, right, initial encounter    right foot   Past Surgical History:  Procedure Laterality Date   APPENDECTOMY     ATRIAL FIBRILLATION ABLATION N/A 05/23/2022   Procedure: ATRIAL FIBRILLATION ABLATION;  Surgeon: Melida Quitter, MD;  Location: [facility];  Service: Cardiovascular;  Laterality: N/A;   CARDIOVERSION N/A 03/29/2022   Procedure: CARDIOVERSION;  Surgeon: Elouise Munroe, MD;  Location: [facility] ENDOSCOPY;  Service: Cardiovascular;  Laterality: N/A;   PROSTATE BIOPSY     robotic mitral valve repair     TENDON TRANSPLANT     PT tendon transplant and calcaneal osteotomy   TONSILLECTOMY      Current Outpatient Medications  Medication Sig Dispense Refill   acetaminophen (TYLENOL) 500 MG tablet Take 1,000 mg by mouth daily as needed for moderate pain.     apixaban (ELIQUIS) 5 MG TABS tablet Take 1 tablet (5 mg total)  by mouth 2 (two) times daily. 60 tablet 11   Ascorbic Acid (VITAMIN C/ROSE HIPS TR) 1000 MG TBCR Take 1,000 mg by mouth in the morning.     carvedilol (COREG) 6.25 MG tablet Take 6.25 mg by mouth 2 (two) times daily with a meal.     Cholecalciferol (VITAMIN D3) 125 MCG (5000 UT) TABS Take 5,000 Units by mouth in the morning.     Coenzyme Q10 300 MG CAPS Take 300 mg by mouth daily.     fexofenadine (ALLEGRA) 180 MG tablet Take 180 mg by mouth daily.     fluticasone (FLONASE) 50 MCG/ACT nasal spray Place 2 sprays into both nostrils daily.     Glucosamine-Chondroit-Calcium (TRIPLE FLEX BONE & JOINT PO) Take 1 tablet by mouth in the morning. [place] Signature Triple Action Joint Health     lisinopril (ZESTRIL) 10 MG tablet Take 1 tablet (10 mg total) by mouth daily. 30 tablet 1   magnesium oxide (MAG-OX) 400 (240 Mg) MG tablet Take 400 mg by mouth every evening.     Multiple Vitamin (MULTIVITAMIN WITH MINERALS) TABS tablet Take 1 tablet by mouth daily. [place] Signature Adult 50+ Mature Multi Vitamins & Minerals     pantoprazole (PROTONIX) 40 MG tablet Take 1 tablet (40 mg total) by mouth daily. 45 tablet 0   rosuvastatin (CRESTOR) 10 MG tablet Take 10 mg by mouth every evening.     TURMERIC PO  Take 1,000 mg by mouth daily.     No current facility-administered medications for this encounter.    No Known Allergies  Social History   Socioeconomic History   Marital status: Single    Spouse name: Not on file   Number of children: 0   Years of education: Not on file   Highest education level: Not on file  Occupational History   Occupation: Chief Financial Officer    Comment: retired  Tobacco Use   Smoking status: Never   Smokeless tobacco: Never  Vaping Use   Vaping Use: Never used  Substance and Sexual Activity   Alcohol use: No    Alcohol/week: 0.0 standard drinks of alcohol   Drug use: No   Sexual activity: Yes  Other Topics Concern   Not on file  Social History Narrative   Not on file    Social Determinants of Health   Financial Resource Strain: Not on file  Food Insecurity: No Food Insecurity (05/23/2022)   Hunger Vital Sign    Worried About Running Out of Food in the Last Year: Never true    Ran Out of Food in the Last Year: Never true  Transportation Needs: No Transportation Needs (05/23/2022)   PRAPARE - Hydrologist (Medical): No    Lack of Transportation (Non-Medical): No  Physical Activity: Not on file  Stress: Not on file  Social Connections: Not on file  Intimate Partner Violence: Not At Risk (05/23/2022)   Humiliation, Afraid, Rape, and Kick questionnaire    Fear of Current or Ex-Partner: No    Emotionally Abused: No    Physically Abused: No    Sexually Abused: No    Family History  Problem Relation Age of Onset   Cancer Mother        unsure pt states,"i believe she had a hysterectomy to remove cancer"   Deep vein thrombosis Sister    Lymphoma Maternal Grandmother     ROS- All systems are reviewed and negative except as per the HPI above  Physical Exam: Vitals:   07/07/22 1416  BP: 132/86  Pulse: 71  Weight: 81.4 kg  Height: 5' 10.5" (1.791 m)   Wt Readings from Last 3 Encounters:  07/07/22 81.4 kg  05/24/22 83.6 kg  05/06/22 84.3 kg    Labs: Lab Results  Component Value Date   NA 142 05/03/2022   K 4.7 05/03/2022   CL 104 05/03/2022   CO2 25 05/03/2022   GLUCOSE 84 05/03/2022   BUN 17 05/03/2022   CREATININE 0.88 05/03/2022   CALCIUM 10.0 05/03/2022   MG 2.4 (H) 02/22/2022   No results found for: "INR" No results found for: "CHOL", "HDL", "[place]", "TRIG"   GEN- The patient is well appearing, alert and oriented x 3 today.   Head- normocephalic, atraumatic Eyes-  Sclera clear, conjunctiva pink Ears- hearing intact Oropharynx- clear Neck- supple, no JVP Lymph- no cervical lymphadenopathy Lungs- Clear to ausculation bilaterally, normal work of breathing Heart- Regular rate and rhythm, no  murmurs, rubs or gallops, PMI not laterally displaced GI- soft, NT, ND, + BS Extremities- no clubbing, cyanosis, or edema MS- no significant deformity or atrophy Skin- no rash or lesion Psych- euthymic mood, full affect Neuro- strength and sensation are intact  EKG-Vent. rate 71 BPM PR interval 152 ms QRS duration 100 ms QT/QTcB 394/428 ms P-R-T axes * 63 -10 Sinus rhythm with occasional Premature ventricular complexes Abnormal QRS-T angle, consider primary T wave abnormality Abnormal ECG  When compared with ECG of 24-May-2022 04:25, PREVIOUS ECG IS PRESENT    Assessment and Plan:  1. Afib  Doing well staying in SR since  ablation and recovery from  covid    Continue carvedilol 6.25 mg bid   2. CHA2DS2VASc  score of 3 Continue eliquis 5 mg bid without any interruption for the 3 month f/u period   F/u with Dr. Myles Gip 08/23/21  Geroge Baseman. Jacquline Terrill, [facility] [address] [place], [place] [address] [phone]

## 2022-07-12 ENCOUNTER — Ambulatory Visit: Payer: Medicare Other | Admitting: Cardiology

## 2022-07-15 ENCOUNTER — Encounter: Payer: Self-pay | Admitting: Pulmonary Disease

## 2022-07-15 ENCOUNTER — Ambulatory Visit (INDEPENDENT_AMBULATORY_CARE_PROVIDER_SITE_OTHER): Payer: Medicare Other | Admitting: Pulmonary Disease

## 2022-07-15 VITALS — BP 110/82 | HR 65 | Temp 97.6°F | Ht 70.5 in | Wt 181.0 lb

## 2022-07-15 DIAGNOSIS — Z8701 Personal history of pneumonia (recurrent): Secondary | ICD-10-CM

## 2022-07-15 DIAGNOSIS — R931 Abnormal findings on diagnostic imaging of heart and coronary circulation: Secondary | ICD-10-CM

## 2022-07-15 NOTE — Patient Instructions (Signed)
Focal area of pulmonary fibrosis in right lung: As described today I am suspicious that this is due to prior lung injury from your mitral valve repair surgery in 2003 I will order a high-resolution CT scan of your chest now and then we will plan on repeating that in 6 months  I will see you back in 6 months or sooner if needed.

## 2022-07-15 NOTE — Progress Notes (Signed)
Synopsis: Referred in January 2024 for interstitial pulmonary parenchymal abnormalities identified on a coronary CT venogram performed prior to A-fib ablation Subjective:   PATIENT ID: Jordan Payne GENDER: male DOB: Sep 17, 1955, MRN: [id number]   HPI  Chief Complaint  Patient presents with  . Consult    Pt states that he had a nasty virus in 2019 and in May 2023. Pt had a CT Scan 6 weeks ago and it showed issues in the lung so the heart doctor wanted him to see a pulmonologist.    "Jordan Payne" is here to see me because he had an abnormal CT scan of his lungs which was seen on a CT venogram ordered in prep for an a-fib ablation.  He had pneumonia with fluid on his lungs in 2019 and was treated with antibiotics.  He said that the treating physician thought he had pneumonia but the radiologist didn't think so.  He tested positive for COVID in 2023 after his a fib ablation and took molnupiravir.  He recalls having a slight dry cough.  He says the afib ablation was effective and he hasn't had any problems since then.  He remains in sinus rhythm.  He stopped caffeine and EtOH.    Record review: November 2023 hospitalization records reviewed where the patient was hospitalized for atrial flutter ablation by Dr. Myles Gip.  Prior to that study a CT pulmonary venogram had been performed and lung windows demonstrate an abnormality in the right middle lobe which is poorly understood and for which the patient has been referred to [place].  See description below.  Past Medical History:  Diagnosis Date  . Arthritis   . DCM (dilated cardiomyopathy) ([place])    EF 40-45% by echo 2015.  EF 55% by echo 2017. EF 50-55% by echo 2021  . Hypertension   . MVP (mitral valve prolapse)    s/p repair  . Prostate cancer ([place])   . PVC's (premature ventricular contractions)   . Tibialis posterior rupture, right, initial encounter    right foot     Family History  Problem Relation Age of Onset  . Cancer Mother         unsure pt states,"i believe she had a hysterectomy to remove cancer"  . Deep vein thrombosis Sister   . Lymphoma Maternal Grandmother      Social History   Socioeconomic History  . Marital status: Single    Spouse name: Not on file  . Number of children: 0  . Years of education: Not on file  . Highest education level: Not on file  Occupational History  . Occupation: Chief Financial Officer    Comment: retired  Tobacco Use  . Smoking status: Never    Passive exposure: Past  . Smokeless tobacco: Never  Vaping Use  . Vaping Use: Never used  Substance and Sexual Activity  . Alcohol use: No    Alcohol/week: 0.0 standard drinks of alcohol  . Drug use: No  . Sexual activity: Yes  Other Topics Concern  . Not on file  Social History Narrative  . Not on file   Social Determinants of Health   Financial Resource Strain: Not on file  Food Insecurity: No Food Insecurity (05/23/2022)   Hunger Vital Sign   . Worried About Charity fundraiser in the Last Year: Never true   . Ran Out of Food in the Last Year: Never true  Transportation Needs: No Transportation Needs (05/23/2022)   PRAPARE - Transportation   . Lack of Transportation (  Medical): No   . Lack of Transportation (Non-Medical): No  Physical Activity: Not on file  Stress: Not on file  Social Connections: Not on file  Intimate Partner Violence: Not At Risk (05/23/2022)   Humiliation, Afraid, Rape, and Kick questionnaire   . Fear of Current or Ex-Partner: No   . Emotionally Abused: No   . Physically Abused: No   . Sexually Abused: No     No Known Allergies   Outpatient Medications Prior to Visit  Medication Sig Dispense Refill  . acetaminophen (TYLENOL) 500 MG tablet Take 1,000 mg by mouth daily as needed for moderate pain.    Marland Kitchen apixaban (ELIQUIS) 5 MG TABS tablet Take 1 tablet (5 mg total) by mouth 2 (two) times daily. 60 tablet 11  . Ascorbic Acid (VITAMIN C/ROSE HIPS TR) 1000 MG TBCR Take 1,000 mg by mouth in the morning.    .  carvedilol (COREG) 6.25 MG tablet Take 6.25 mg by mouth 2 (two) times daily with a meal.    . Cholecalciferol (VITAMIN D3) 125 MCG (5000 UT) TABS Take 5,000 Units by mouth in the morning.    . Coenzyme Q10 300 MG CAPS Take 300 mg by mouth daily.    . fexofenadine (ALLEGRA) 180 MG tablet Take 180 mg by mouth daily.    . fluticasone (FLONASE) 50 MCG/ACT nasal spray Place 2 sprays into both nostrils daily.    . Glucosamine-Chondroit-Calcium (TRIPLE FLEX BONE & JOINT PO) Take 1 tablet by mouth in the morning. Luke    . lisinopril (ZESTRIL) 10 MG tablet Take 1 tablet (10 mg total) by mouth daily. 30 tablet 1  . magnesium oxide (MAG-OX) 400 (240 Mg) MG tablet Take 400 mg by mouth every evening.    . Multiple Vitamin (MULTIVITAMIN WITH MINERALS) TABS tablet Take 1 tablet by mouth daily. [employer] Signature Adult 50+ Mature Multi Vitamins & Minerals    . rosuvastatin (CRESTOR) 10 MG tablet Take 10 mg by mouth every evening.    . TURMERIC PO Take 1,000 mg by mouth daily.    . pantoprazole (PROTONIX) 40 MG tablet Take 1 tablet (40 mg total) by mouth daily. 45 tablet 0   No facility-administered medications prior to visit.    Review of Systems  Constitutional:  Negative for chills, fever, malaise/fatigue and weight loss.  HENT:  Negative for congestion, nosebleeds, sinus pain and sore throat.   Eyes:  Negative for photophobia, pain and discharge.  Respiratory:  Negative for cough, hemoptysis, sputum production, shortness of breath and wheezing.   Cardiovascular:  Negative for chest pain, palpitations, orthopnea and leg swelling.  Gastrointestinal:  Negative for abdominal pain, constipation, diarrhea, nausea and vomiting.  Genitourinary:  Negative for dysuria, frequency, hematuria and urgency.  Musculoskeletal:  Negative for back pain, joint pain, myalgias and neck pain.  Skin:  Negative for itching and rash.  Neurological:  Negative for tingling, tremors,  sensory change, speech change, focal weakness, seizures, weakness and headaches.  Psychiatric/Behavioral:  Negative for memory loss, substance abuse and suicidal ideas. The patient is not nervous/anxious.       Objective:  Physical Exam   Vitals:   07/15/22 1408  BP: 110/82  Pulse: 65  Temp: 97.6 F (36.4 C)  TempSrc: Oral  SpO2: 98%  Weight: 181 lb (82.1 kg)  Height: 5' 10.5" (1.791 m)    Gen: well appearing, no acute distress HENT: NCAT, OP clear, neck supple without masses Eyes: PERRL, EOMi Lymph: no cervical  lymphadenopathy PULM: CTA B CV: RRR, no mgr, no JVD GI: BS+, soft, nontender, no hsm Derm: no rash or skin breakdown MSK: normal bulk and tone Neuro: A&Ox4, CN II-XII intact, strength 5/5 in all 4 extremities Psyche: normal mood and affect   CBC    Component Value Date/Time   WBC 6.3 05/03/2022 1001   WBC 11.9 (H) 04/05/2020 0829   RBC 4.84 05/03/2022 1001   RBC 5.19 04/05/2020 0829   HGB 15.6 05/03/2022 1001   HCT 45.5 05/03/2022 1001   PLT 256 05/03/2022 1001   MCV 94 05/03/2022 1001   MCH 32.2 05/03/2022 1001   MCH 31.6 04/05/2020 0829   MCHC 34.3 05/03/2022 1001   MCHC 33.5 04/05/2020 0829   RDW 11.9 05/03/2022 1001   LYMPHSABS 1.4 05/03/2022 1001   EOSABS 0.2 05/03/2022 1001   BASOSABS 0.0 05/03/2022 1001     Chest imaging: 2007 chest x-ray personally reviewed showing a deep sulcus on the right, otherwise normal pulmonary parenchyma May 17, 2022 CT heart pulmonary venogram lung windows independently reviewed by me showing nonspecific interstitial abnormalities along the periphery and base of the right lower lobe, in addition there is some rounded atelectasis, a focal area of fibrosis with nonspecific changes, no honeycombing or traction bronchiectasis in the right middle lobe, question prior chest trauma  PFT:  Labs:  Path:  Echo:  Heart Catheterization:       Assessment & Plan:   No diagnosis found.  Discussion: [place]  has a very unusual focal area of fibrosis in his right middle lobe which I think is due to lung injury from minimally invasive mitral valve repair surgery in 2003.  He has an overlying scar on his chest and the pleural-based nature of the scarring pattern is best explained by pleural and lung injury.  For completeness it would be best to get a high-resolution CT scan of the chest to make sure there is nothing else going on and of course we will need to follow this for about 6 months to make sure that it does not progress.  However, I have a very low index of suspicion of any underlying lung disease which will turn into a problem for him down the road.  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  Immunizations: Immunization History  Administered Date(s) Administered  . Influenza Inj Mdck Quad Pf 04/27/2018  . Moderna Covid-19 Vaccine Bivalent Booster 31yr & up 03/31/2021  . PFIZER(Purple Top)SARS-COV-2 Vaccination 09/20/2019, 10/11/2019, 04/14/2020     Current Outpatient Medications:  .  acetaminophen (TYLENOL) 500 MG tablet, Take 1,000 mg by mouth daily as needed for moderate pain., Disp: , Rfl:  .  apixaban (ELIQUIS) 5 MG TABS tablet, Take 1 tablet (5 mg total) by mouth 2 (two) times daily., Disp: 60 tablet, Rfl: 11 .  Ascorbic Acid (VITAMIN C/ROSE HIPS TR) 1000 MG TBCR, Take 1,000 mg by mouth in the morning., Disp: , Rfl:  .  carvedilol (COREG) 6.25 MG tablet, Take 6.25 mg by mouth 2 (two) times daily with a meal., Disp: , Rfl:  .  Cholecalciferol (VITAMIN D3) 125 MCG (5000 UT) TABS, Take 5,000 Units by  mouth in the morning., Disp: , Rfl:  .  Coenzyme Q10 300 MG CAPS, Take 300 mg by mouth daily., Disp:  , Rfl:  .  fexofenadine (ALLEGRA) 180 MG tablet, Take 180 mg by mouth daily., Disp: , Rfl:  .  fluticasone (FLONASE) 50 MCG/ACT nasal spray, Place 2 sprays into both nostrils daily., Disp: , Rfl:  .  Glucosamine-Chondroit-Calcium (TRIPLE FLEX BONE & JOINT PO), Take 1 tablet by mouth in the morning. [employer] Production assistant, [employer], Disp: , Rfl:  .  lisinopril (ZESTRIL) 10 MG tablet, Take 1 tablet (10 mg total) by mouth daily., Disp: 30 tablet, Rfl: 1 .  magnesium oxide (MAG-OX) 400 (240 Mg) MG tablet, Take 400 mg by mouth every evening., Disp: , Rfl:  .  Multiple Vitamin (MULTIVITAMIN WITH MINERALS) TABS tablet, Take 1 tablet by mouth daily. [employer] Adult 50+ Mature Multi Vitamins & Minerals, Disp: , Rfl:  .  rosuvastatin (CRESTOR) 10 MG tablet, Take 10 mg by mouth every evening., Disp: , Rfl:  .  TURMERIC PO, Take 1,000 mg by mouth daily., Disp: , Rfl:  .  pantoprazole (PROTONIX) 40 MG tablet, Take 1 tablet (40 mg total) by mouth daily., Disp: 45 tablet, Rfl: 0

## 2022-07-25 ENCOUNTER — Ambulatory Visit (HOSPITAL_BASED_OUTPATIENT_CLINIC_OR_DEPARTMENT_OTHER): Payer: Medicare Other | Attending: Cardiology | Admitting: Cardiology

## 2022-07-25 VITALS — Ht 70.5 in | Wt 175.0 lb

## 2022-07-25 DIAGNOSIS — I493 Ventricular premature depolarization: Secondary | ICD-10-CM | POA: Diagnosis not present

## 2022-07-25 DIAGNOSIS — G4731 Primary central sleep apnea: Secondary | ICD-10-CM | POA: Diagnosis not present

## 2022-07-25 DIAGNOSIS — I10 Essential (primary) hypertension: Secondary | ICD-10-CM | POA: Diagnosis not present

## 2022-07-25 DIAGNOSIS — G4733 Obstructive sleep apnea (adult) (pediatric): Secondary | ICD-10-CM | POA: Diagnosis not present

## 2022-07-25 DIAGNOSIS — G4719 Other hypersomnia: Secondary | ICD-10-CM | POA: Insufficient documentation

## 2022-07-26 ENCOUNTER — Ambulatory Visit (HOSPITAL_COMMUNITY)
Admission: RE | Admit: 2022-07-26 | Discharge: 2022-07-26 | Disposition: A | Discharge status: Home / Self Care | Payer: Medicare Other | Source: Ambulatory Visit | Attending: Pulmonary Disease | Admitting: Pulmonary Disease

## 2022-07-26 DIAGNOSIS — Z8701 Personal history of pneumonia (recurrent): Secondary | ICD-10-CM | POA: Diagnosis present

## 2022-07-26 DIAGNOSIS — R931 Abnormal findings on diagnostic imaging of heart and coronary circulation: Secondary | ICD-10-CM | POA: Insufficient documentation

## 2022-07-26 NOTE — Procedures (Signed)
   Patient Name: Jordan Payne, Jordan Payne Date: 07/25/2022 Gender: Male D.O.B: 03/02/1956 Age (years): 86 Referring Provider: Fransico Him MD, ABSM Height (inches): 71 Interpreting Physician: Fransico Him MD, ABSM Weight (lbs): 175 RPSGT: Carolin Coy BMI: 25 MRN: [id number] Neck Size: 15.00  CLINICAL INFORMATION The patient is referred for a CPAP titration to treat sleep apnea.  SLEEP STUDY TECHNIQUE As per the AASM Manual for the Scoring of Sleep and Associated Events v2.3 (April 2016) with a hypopnea requiring 4% desaturations.  The channels recorded and monitored were frontal, central and occipital EEG, electrooculogram (EOG), submentalis EMG (chin), nasal and oral airflow, thoracic and abdominal wall motion, anterior tibialis EMG, snore microphone, electrocardiogram, and pulse oximetry. Continuous positive airway pressure (CPAP) was initiated at the beginning of the study and titrated to treat sleep-disordered breathing.  MEDICATIONS Medications self-administered by patient taken the night of the study : ALLEGRA, COREG, CRESTOR, ELIQUIS, MAGNESIUM  TECHNICIAN COMMENTS Comments added by technician: PATIENT WAS ORDERED AS A CPAP TITRATION. Comments added by scorer: N/A  RESPIRATORY PARAMETERS Optimal PAP Pressure (cm): 16  AHI at Optimal Pressure (/hr):0 Overall Minimal O2 (%):89.0  Supine % at Optimal Pressure (%):100 Minimal O2 at Optimal Pressure (%): 91.0   SLEEP ARCHITECTURE The study was initiated at 10:39:00 PM and ended at 5:07:48 AM.  Sleep onset time was 45.6 minutes and the sleep efficiency was 27.5%. The total sleep time was 107 minutes.  The patient spent 22.9% of the night in stage N1 sleep, 77.1% in stage N2 sleep, 0.0% in stage N3 and 0% in REM.Stage REM latency was N/A minutes  Wake after sleep onset was 236.2. Alpha intrusion was absent. Supine sleep was 25.70%.  CARDIAC DATA The 2 lead EKG demonstrated sinus rhythm. The mean heart rate was 76.3 beats  per minute. Other EKG findings include: PVCs.  LEG MOVEMENT DATA The total Periodic Limb Movements of Sleep (PLMS) were 0. The PLMS index was 0.0. A PLMS index of <15 is considered normal in adults.  IMPRESSIONS - The optimal PAP pressure was 16 cm of water. - Mild Central Sleep Apnea was noted during this titration (CAI = 6.2/h). - Mild oxygen desaturations were observed during this titration (min O2 = 89.0%). - No snoring was audible during this study. - 2-lead EKG demonstrated: PVCs - Clinically significant periodic limb movements were not noted during this study. Arousals associated with PLMs were rare.  DIAGNOSIS - Obstructive Sleep Apnea (G47.33)  RECOMMENDATIONS - Trial of ResMed CPAP therapy on 16 cm H2O with a Small-Medium size Fisher&Paykel Full Face Evora Full mask and heated humidification. - Avoid alcohol, sedatives and other CNS depressants that may worsen sleep apnea and disrupt normal sleep architecture. - Sleep hygiene should be reviewed to assess factors that may improve sleep quality. - Weight management and regular exercise should be initiated or continued. - Return to Sleep Center for re-evaluation after 6 weeks of therapy  [Electronically signed] 07/26/2022 01:02 PM  Fransico Him MD, ABSM Diplomate, American Board of Sleep Medicine

## 2022-07-28 ENCOUNTER — Other Ambulatory Visit: Payer: Self-pay

## 2022-07-28 ENCOUNTER — Telehealth: Payer: Self-pay

## 2022-07-28 ENCOUNTER — Telehealth: Payer: Self-pay | Admitting: *Deleted

## 2022-07-28 DIAGNOSIS — G4719 Other hypersomnia: Secondary | ICD-10-CM

## 2022-07-28 DIAGNOSIS — J849 Interstitial pulmonary disease, unspecified: Secondary | ICD-10-CM

## 2022-07-28 DIAGNOSIS — G4733 Obstructive sleep apnea (adult) (pediatric): Secondary | ICD-10-CM

## 2022-07-28 NOTE — Telephone Encounter (Signed)
The patient has been notified of the result and verbalized understanding.  All questions (if any) were answered. Jordan Payne, [place] 07/28/2022 6:41 PM    Patient states when he woke up his cheeks were being blown out on the pressure of 16 cm. He wants to hold his order until he has his visit with Dr Radford Pax.

## 2022-07-28 NOTE — Telephone Encounter (Signed)
Dr.McQuaid, At patient's last OV you stated that you you wanted to plan on repeating CT scan in 6 month's.CT scan was not ordered did you want [place] to put that order in for patient to have CT scan done before next office visit in 6 months. Please advise Thank you

## 2022-07-28 NOTE — Telephone Encounter (Signed)
6 month high resoltion CT scan was placed per Dr.McQuaid. Nothing else further needed

## 2022-07-28 NOTE — Telephone Encounter (Signed)
-----  Message from Lauralee Evener, [place] sent at 07/26/2022  1:51 PM EST -----  ----- Message ----- From: Sueanne Margarita, MD Sent: 07/26/2022   1:04 PM EST To: Cv Div Sleep Studies  Please let patient know that they had a successful PAP titration and let DME know that orders are in EPIC.  Please set up 6 week OV with me.

## 2022-07-29 ENCOUNTER — Telehealth: Payer: Self-pay | Admitting: *Deleted

## 2022-07-29 DIAGNOSIS — G4719 Other hypersomnia: Secondary | ICD-10-CM

## 2022-07-29 DIAGNOSIS — G4733 Obstructive sleep apnea (adult) (pediatric): Secondary | ICD-10-CM

## 2022-07-29 DIAGNOSIS — I10 Essential (primary) hypertension: Secondary | ICD-10-CM

## 2022-07-29 NOTE — Telephone Encounter (Signed)
The patient has been notified of the result and verbalized understanding.  All questions (if any) were answered. Jordan Payne, [place] 07/29/2022 2:32 PM    Patient will wait to see Dr Radford Pax on 08/09/22 before making a decision to get his cpap.

## 2022-07-29 NOTE — Telephone Encounter (Signed)
-----  Message from Lauralee Evener, [place] sent at 07/26/2022  1:51 PM EST -----  ----- Message ----- From: Sueanne Margarita, MD Sent: 07/26/2022   1:04 PM EST To: Cv Div Sleep Studies  Please let patient know that they had a successful PAP titration and let DME know that orders are in EPIC.  Please set up 6 week OV with me.

## 2022-08-02 NOTE — Telephone Encounter (Signed)
Patient was complaining about the pressure during the titration. He does not have a cpap machine yet.

## 2022-08-09 ENCOUNTER — Encounter: Payer: Self-pay | Admitting: Cardiology

## 2022-08-09 ENCOUNTER — Ambulatory Visit: Payer: Medicare Other | Attending: Cardiology | Admitting: Cardiology

## 2022-08-09 VITALS — BP 120/62 | HR 70 | Ht 70.5 in | Wt 177.6 lb

## 2022-08-09 DIAGNOSIS — I420 Dilated cardiomyopathy: Secondary | ICD-10-CM

## 2022-08-09 DIAGNOSIS — R931 Abnormal findings on diagnostic imaging of heart and coronary circulation: Secondary | ICD-10-CM | POA: Diagnosis not present

## 2022-08-09 DIAGNOSIS — I493 Ventricular premature depolarization: Secondary | ICD-10-CM

## 2022-08-09 DIAGNOSIS — G4733 Obstructive sleep apnea (adult) (pediatric): Secondary | ICD-10-CM

## 2022-08-09 DIAGNOSIS — I10 Essential (primary) hypertension: Secondary | ICD-10-CM | POA: Diagnosis not present

## 2022-08-09 DIAGNOSIS — I4819 Other persistent atrial fibrillation: Secondary | ICD-10-CM | POA: Diagnosis present

## 2022-08-09 DIAGNOSIS — Z9889 Other specified postprocedural states: Secondary | ICD-10-CM

## 2022-08-09 DIAGNOSIS — E7800 Pure hypercholesterolemia, unspecified: Secondary | ICD-10-CM | POA: Diagnosis present

## 2022-08-09 DIAGNOSIS — E785 Hyperlipidemia, unspecified: Secondary | ICD-10-CM | POA: Insufficient documentation

## 2022-08-09 MED ORDER — LISINOPRIL 10 MG PO TABS: 10.0000 mg | ORAL_TABLET | Freq: Every day | ORAL | 3 refills | Status: DC

## 2022-08-09 MED ORDER — CARVEDILOL 12.5 MG PO TABS: 12.5000 mg | ORAL_TABLET | Freq: Two times a day (BID) | ORAL | 3 refills | Status: DC

## 2022-08-09 NOTE — Patient Instructions (Addendum)
Medication Instructions:  Your physician recommends that you continue on your current medications as directed. Please refer to the Current Medication list given to you today.  *If you need a refill on your cardiac medications before your next appointment, please call your pharmacy*  Lab Work: Come back for fasting lipids, ALT and CBC If you have labs (blood work) drawn today and your tests are completely normal, you will receive your results only by: [place] (if you have MyChart) OR A paper copy in the mail If you have any lab test that is abnormal or we need to change your treatment, we will call you to review the results.   Testing/Procedures: Your physician has requested that you have a Cardiac PET/CT scan. Please see below for further instructions.   Follow-Up: At [facility], you and your health needs are our priority.  As part of our continuing mission to provide you with exceptional heart care, we have created designated Provider Care Teams.  These Care Teams include your primary Cardiologist (physician) and Advanced Practice Providers (APPs -  Physician Assistants and Nurse Practitioners) who all work together to provide you with the care you need, when you need it.  Your next appointment:   1 year(s)  Provider:   Fransico Him, MD     Other Instructions How to Prepare for Your Cardiac PET/CT Stress Test:  1. Please do not take these medications before your test:   Medications that may interfere with the cardiac pharmacological stress agent (ex. nitrates - including erectile dysfunction medications, isosorbide mononitrate or beta-blockers) the day of the exam. -carvedilol Erectile dysfunction medication should be held for at least 72 hrs prior to test Your remaining medications may be taken with water.  2. Nothing to eat or drink, except water, 3 hours prior to arrival time.   NO caffeine/decaffeinated products, or chocolate 12 hours prior to arrival.  3.  NO perfume, cologne or lotion  4. Total time is 1 to 2 hours; you may want to bring reading material for the waiting time.  5. Please report to Admitting at the [place] Entrance 30 minutes early for your test.  [place], [place] [address]   IF YOU THINK YOU MAY BE PREGNANT, OR ARE NURSING PLEASE INFORM THE TECHNOLOGIST.  In preparation for your appointment, medication and supplies will be purchased.  Appointment availability is limited, so if you need to cancel or reschedule, please call the Radiology Department at [phone]  24 hours in advance to avoid a cancellation fee of $100.00  What to Expect After you Arrive:  Once you arrive and check in for your appointment, you will be taken to a preparation room within the Radiology Department.  A technologist or Nurse will obtain your medical history, verify that you are correctly prepped for the exam, and explain the procedure.  Afterwards,  an IV will be started in your arm and electrodes will be placed on your skin for EKG monitoring during the stress portion of the exam. Then you will be escorted to the PET/CT scanner.  There, staff will get you positioned on the scanner and obtain a blood pressure and EKG.  During the exam, you will continue to be connected to the EKG and blood pressure machines.  A small, safe amount of a radioactive tracer will be injected in your IV to obtain a series of pictures of your heart along with an injection of a stress agent.  After your Exam:  It is recommended that you eat a meal and drink a caffeinated beverage to counter act any effects of the stress agent.  Drink plenty of fluids for the remainder of the day and urinate frequently for the first couple of hours after the exam.  Your doctor will inform you of your test results within 7-10 business days.  For questions about your test or how to prepare for your test, please call: Marchia Bond, Cardiac Imaging Nurse  Navigator  Gordy Clement, Cardiac Imaging Nurse Navigator Office: [phone]

## 2022-08-09 NOTE — Addendum Note (Signed)
Addended by: Sueanne Margarita on: 08/09/2022 03:16 PM   Modules accepted: Orders

## 2022-08-09 NOTE — Telephone Encounter (Signed)
Per Dr Radford Pax, order ResMed auto CPAP from 4 to 20cm H2O and followup with me in 6 weeks

## 2022-08-09 NOTE — Addendum Note (Signed)
Addended by: Freada Bergeron on: 08/09/2022 04:09 PM   Modules accepted: Orders

## 2022-08-09 NOTE — Addendum Note (Signed)
Addended by: Bernestine Amass on: 08/09/2022 02:56 PM   Modules accepted: Orders

## 2022-08-09 NOTE — Progress Notes (Signed)
Cardiology Office Note:    Date:  08/09/2022   ID:  Jordan Payne, DOB 10/29/1955, MRN [id number]  PCP:  Lawerance Cruel, MD  Cardiologist:  Fransico Him, MD    Referring MD: Lawerance Cruel, MD   Chief Complaint  Patient presents with   Mitral Regurgitation   Hypertension   Cardiomyopathy    History of Present Illness:    Jordan Payne is a 67 y.o. male with a hx of MVP s/p MV repair, PAF followed by Dr. Myles Gip and afib clinic, HTN and DCM (EF normalized to 55% on echo 09/23/2015). He is here today for followup and is doing well.  He denies any chest pain or pressure, SOB, DOE, PND, orthopnea, LE edema, dizziness or syncope. He has only had 1 episode of palpitations after eating some cheesecake. He is compliant with his meds and is tolerating meds with no SE.    He was sent for a sleep study due to PAF which showed severe OSA with an AHI of 32/hr and no significant central events. He underwent CPAP titration to 16cm H2O.  He has not started CPAP yet because he was concerned that he should be on an auto PAP instead of a set pressure.    Past Medical History:  Diagnosis Date   Arthritis    DCM (dilated cardiomyopathy) ([place])    EF 40-45% by echo 2015.  EF 55% by echo 2017. EF 50-55% by echo 2021   Hypertension    MVP (mitral valve prolapse)    s/p repair   Prostate cancer ([place])    PVC's (premature ventricular contractions)    Tibialis posterior rupture, right, initial encounter    right foot    Past Surgical History:  Procedure Laterality Date   APPENDECTOMY     ATRIAL FIBRILLATION ABLATION N/A 05/23/2022   Procedure: ATRIAL FIBRILLATION ABLATION;  Surgeon: Melida Quitter, MD;  Location: [place] [facility];  Service: Cardiovascular;  Laterality: N/A;   CARDIOVERSION N/A 03/29/2022   Procedure: CARDIOVERSION;  Surgeon: Elouise Munroe, MD;  Location: [facility] ENDOSCOPY;  Service: Cardiovascular;  Laterality: N/A;   PROSTATE BIOPSY     robotic mitral valve repair      TENDON TRANSPLANT     PT tendon transplant and calcaneal osteotomy   TONSILLECTOMY      Current Medications: Current Meds  Medication Sig   acetaminophen (TYLENOL) 500 MG tablet Take 1,000 mg by mouth daily as needed for moderate pain.   apixaban (ELIQUIS) 5 MG TABS tablet Take 1 tablet (5 mg total) by mouth 2 (two) times daily.   Ascorbic Acid (VITAMIN C/ROSE HIPS TR) 1000 MG TBCR Take 1,000 mg by mouth in the morning.   Cholecalciferol (VITAMIN D3) 125 MCG (5000 UT) TABS Take 5,000 Units by mouth in the morning.   Coenzyme Q10 300 MG CAPS Take 100 mg by mouth daily.   fexofenadine (ALLEGRA) 180 MG tablet Take 180 mg by mouth daily.   fluticasone (FLONASE) 50 MCG/ACT nasal spray Place 2 sprays into both nostrils daily.   Glucosamine-Chondroit-Calcium (TRIPLE FLEX BONE & JOINT PO) Take 1 tablet by mouth in the morning. [place] Signature Triple Action Joint Health   magnesium oxide (MAG-OX) 400 (240 Mg) MG tablet Take 400 mg by mouth every evening.   Multiple Vitamin (MULTIVITAMIN WITH MINERALS) TABS tablet Take 1 tablet by mouth daily. [place] Signature Adult 50+ Mature Multi Vitamins & Minerals   rosuvastatin (CRESTOR) 10 MG tablet Take 10 mg by mouth every evening.  TURMERIC PO Take 1,000 mg by mouth daily.   [DISCONTINUED] carvedilol (COREG) 6.25 MG tablet Take 6.25 mg by mouth 2 (two) times daily with a meal.   [DISCONTINUED] lisinopril (ZESTRIL) 10 MG tablet Take 1 tablet (10 mg total) by mouth daily.     Allergies:   Patient has no known allergies.   Social History   Socioeconomic History   Marital status: Single    Spouse name: Not on file   Number of children: 0   Years of education: Not on file   Highest education level: Not on file  Occupational History   Occupation: Chief Financial Officer    Comment: retired  Tobacco Use   Smoking status: Never    Passive exposure: Past   Smokeless tobacco: Never  Vaping Use   Vaping Use: Never used  Substance and Sexual Activity   Alcohol  use: No    Alcohol/week: 0.0 standard drinks of alcohol   Drug use: No   Sexual activity: Yes  Other Topics Concern   Not on file  Social History Narrative   Not on file   Social Determinants of Health   Financial Resource Strain: Not on file  Food Insecurity: No Food Insecurity (05/23/2022)   Hunger Vital Sign    Worried About Running Out of Food in the Last Year: Never true    Ran Out of Food in the Last Year: Never true  Transportation Needs: No Transportation Needs (05/23/2022)   PRAPARE - Hydrologist (Medical): No    Lack of Transportation (Non-Medical): No  Physical Activity: Not on file  Stress: Not on file  Social Connections: Not on file     Family History: The patient's family history includes Cancer in his mother; Deep vein thrombosis in his sister; Lymphoma in his maternal grandmother.  ROS:   Please see the history of present illness.    ROS  All other systems reviewed and negative.   EKGs/Labs/Other Studies Reviewed:    The following studies were reviewed today: 2D echo IMPRESSIONS    1. Left ventricular ejection fraction, by estimation, is 50 to 55%. The  left ventricle has low normal function. The left ventricle has no regional  wall motion abnormalities. Left ventricular diastolic function could not  be evaluated.   2. Right ventricular systolic function is normal. The right ventricular  size is normal.   3. Left atrial size was moderately dilated.   4. The mitral valve has been repaired/replaced. No evidence of mitral  valve regurgitation. There is a present in the mitral position.   5. The aortic valve is tricuspid. Aortic valve regurgitation is not  visualized. No aortic stenosis is present.   6. Aortic dilatation noted. There is borderline dilatation of the  ascending aorta measuring 37 mm.   7. The inferior vena cava is normal in size with greater than 50%  respiratory variability, suggesting right atrial pressure  of 3 mmHg.   Comparison(s): No significant change from prior study.   EKG:  EKG is not ordered today.    Recent Labs: 02/22/2022: ALT 11; Magnesium 2.4; TSH 2.500 05/03/2022: BUN 17; Creatinine, Ser 0.88; Hemoglobin 15.6; Platelets 256; Potassium 4.7; Sodium 142   Recent Lipid Panel No results found for: "CHOL", "TRIG", "HDL", "CHOLHDL", "VLDL", "LDLCALC", "LDLDIRECT"  Physical Exam:    VS:  BP 120/62   Pulse 70   Ht 5' 10.5" (1.791 m)   Wt 177 lb 9.6 oz (80.6 kg)   SpO2 99%  BMI 25.12 kg/m     Wt Readings from Last 3 Encounters:  08/09/22 177 lb 9.6 oz (80.6 kg)  07/25/22 175 lb (79.4 kg)  07/15/22 181 lb (82.1 kg)    GEN: Well nourished, well developed in no acute distress HEENT: Normal NECK: No JVD; No carotid bruits LYMPHATICS: No lymphadenopathy CARDIAC:RRR, no murmurs, rubs, gallops, occasional ectopy RESPIRATORY:  Clear to auscultation without rales, wheezing or rhonchi  ABDOMEN: Soft, non-tender, non-distended MUSCULOSKELETAL:  No edema; No deformity  SKIN: Warm and dry NEUROLOGIC:  Alert and oriented x 3 PSYCHIATRIC:  Normal affect     1. H/O mitral valve repair   2. DCM (dilated cardiomyopathy) ([place])   3. Essential hypertension   4. PVC's (premature ventricular contractions)   5. Agatston coronary artery calcium score greater than 400   6. Pure hypercholesterolemia    PLAN:    In order of problems listed above:  1.  MVP with MR  -status post mitral valve repair.   -2D echo 11/2019 showed stable mitral valve repair with mean mitral valve gradient 3.5 mmHg and no MR   2.  Dilated cardiomyopathy  2D echo 11/2019 showed EF 50-55%   3.  Hypertension -BP is controlled on exam today -Continue prescription drug management with carvedilol 12.5 mg twice daily and lisinopril 10 mg daily with as needed refills -I have personally reviewed and interpreted outside labs performed by patient's PCP which showed serum creatinine 0.88 and potassium 4.7 on  05/03/2022   4.  PVC's  -he has not had any palpitations since I saw him last -Continue carvedilol  5.  OSA -severe OSA with an AHI of 32/hr and no significant central events. He underwent CPAP titration to 16cm H2O. -he has not started on CPAP because he was concerned that the titrated pressure was too high -I have recommended that he go on CPAP therapy which should help decrease his Afib events -I will order [employer]Med auto CPAP from 4 to 20cm H2O and followup with me in 6 weeks  6.  Coronary artery calcifications -coronary artery Ca score was 1274 -denies any chest pain or SOB -will get a Stress PET CT -Shared Decision Making/Informed Consent The risks [chest pain, shortness of breath, cardiac arrhythmias, dizziness, blood pressure fluctuations, myocardial infarction, stroke/transient ischemic attack, nausea, vomiting, allergic reaction, radiation exposure, metallic taste sensation and life-threatening complications (estimated to be 1 in 10,000)], benefits (risk stratification, diagnosing coronary artery disease, treatment guidance) and alternatives of a cardiac PET stress test were discussed in detail with Mr. Duecker and he agrees to proceed. -no ASA due to DOAC  7.  PAF -he is followed in afib clinic and EP -s/p ablation 05/2022 -CHADS2VASC score 3 (age>65, HTN, CAD) -continue prescription drug management with Carvedilol 12.'5mg'$  BID and Apixaban '5mg'$  BID with PRN refills -I have personally reviewed and interpreted outside labs performed by patient's PCP which showed SCr 0.88 on 05/03/22  8.  HLD -LDL goal < 70 -repeat FLP and ALT -continue prescription drug management with Crestor '10mg'$  daily with PRN    Medication Adjustments/Labs and Tests Ordered: Current medicines are reviewed at length with the patient today.  Concerns regarding medicines are outlined above.  No orders of the defined types were placed in this encounter.  Meds ordered this encounter  Medications    lisinopril (ZESTRIL) 10 MG tablet    Sig: Take 1 tablet (10 mg total) by mouth daily.    Dispense:  90 tablet    Refill:  3  carvedilol (COREG) 12.5 MG tablet    Sig: Take 1 tablet (12.5 mg total) by mouth 2 (two) times daily with a meal.    Dispense:  180 tablet    Refill:  3    Signed, Fransico Him, MD  08/09/2022 2:41 PM    [place]

## 2022-08-09 NOTE — Telephone Encounter (Addendum)
DME selection is [facility]. Patient understands he will be contacted by [facility] to set up his cpap. Patient understands to call if [place] [facility] does not contact him with new setup in a timely manner. Patient understands they will be called once confirmation has been received from [facility]/ that they have received their new machine to schedule 10 week follow up appointment.   [place] notified of new cpap order  Please add to airview Patient was grateful for the call and thanked me.

## 2022-08-09 NOTE — Addendum Note (Signed)
Addended by: Bernestine Amass on: 08/09/2022 03:01 PM   Modules accepted: Orders

## 2022-08-10 ENCOUNTER — Ambulatory Visit: Payer: Medicare Other | Attending: Cardiology

## 2022-08-10 DIAGNOSIS — I420 Dilated cardiomyopathy: Secondary | ICD-10-CM

## 2022-08-10 DIAGNOSIS — I493 Ventricular premature depolarization: Secondary | ICD-10-CM

## 2022-08-10 DIAGNOSIS — R931 Abnormal findings on diagnostic imaging of heart and coronary circulation: Secondary | ICD-10-CM

## 2022-08-10 DIAGNOSIS — I10 Essential (primary) hypertension: Secondary | ICD-10-CM

## 2022-08-10 DIAGNOSIS — Z9889 Other specified postprocedural states: Secondary | ICD-10-CM

## 2022-08-10 DIAGNOSIS — G4733 Obstructive sleep apnea (adult) (pediatric): Secondary | ICD-10-CM

## 2022-08-10 DIAGNOSIS — I4819 Other persistent atrial fibrillation: Secondary | ICD-10-CM

## 2022-08-10 DIAGNOSIS — E7800 Pure hypercholesterolemia, unspecified: Secondary | ICD-10-CM

## 2022-08-11 ENCOUNTER — Telehealth: Payer: Self-pay | Admitting: Cardiology

## 2022-08-11 DIAGNOSIS — E7800 Pure hypercholesterolemia, unspecified: Secondary | ICD-10-CM

## 2022-08-11 LAB — LIPID PANEL
Chol/HDL Ratio: 4.1 ratio (ref 0.0–5.0)
Cholesterol, Total: 154 mg/dL (ref 100–199)
HDL: 38 mg/dL — ABNORMAL LOW (ref >39)
LDL Chol Calc (NIH): 92 mg/dL (ref 0–99)
Triglycerides: 134 mg/dL (ref 0–149)
VLDL Cholesterol Cal: 24 mg/dL (ref 5–40)

## 2022-08-11 LAB — CBC
Hematocrit: 46.9 % (ref 37.5–51.0)
Hemoglobin: 15.7 g/dL (ref 13.0–17.7)
MCH: 31.4 pg (ref 26.6–33.0)
MCHC: 33.5 g/dL (ref 31.5–35.7)
MCV: 94 fL (ref 79–97)
Platelets: 233 10*3/uL (ref 150–450)
RBC: 5 x10E6/uL (ref 4.14–5.80)
RDW: 12.6 % (ref 11.6–15.4)
WBC: 6 10*3/uL (ref 3.4–10.8)

## 2022-08-11 LAB — ALT: ALT: 13 IU/L (ref 0–44)

## 2022-08-11 MED ORDER — ROSUVASTATIN CALCIUM 20 MG PO TABS: 20.0000 mg | ORAL_TABLET | Freq: Every day | ORAL | 3 refills | Status: DC

## 2022-08-11 NOTE — Telephone Encounter (Signed)
Patient is returning call to disucss lab results.

## 2022-08-11 NOTE — Telephone Encounter (Signed)
-----  Message from Sueanne Margarita, MD sent at 08/11/2022  8:27 AM EST ----- LDL not at goal - increase Crestor to '20mg'$  daily and repeat FLP and ALT in 6 weeks

## 2022-08-11 NOTE — Telephone Encounter (Signed)
The patient has been notified of the result and verbalized understanding.  All questions (if any) were answered. Bernestine Amass, RN 08/11/2022 4:53 PM  Rx has been sent in and labs scheduled.

## 2022-08-18 ENCOUNTER — Encounter (HOSPITAL_COMMUNITY): Payer: Self-pay | Admitting: *Deleted

## 2022-08-23 ENCOUNTER — Encounter: Payer: Self-pay | Admitting: Cardiovascular Disease

## 2022-08-23 ENCOUNTER — Other Ambulatory Visit: Payer: Self-pay | Admitting: Cardiovascular Disease

## 2022-08-23 ENCOUNTER — Ambulatory Visit: Payer: Medicare Other | Attending: Cardiovascular Disease | Admitting: Cardiovascular Disease

## 2022-08-23 VITALS — BP 116/76 | HR 73 | Ht 70.5 in | Wt 181.2 lb

## 2022-08-23 DIAGNOSIS — I480 Paroxysmal atrial fibrillation: Secondary | ICD-10-CM | POA: Diagnosis present

## 2022-08-23 DIAGNOSIS — G4733 Obstructive sleep apnea (adult) (pediatric): Secondary | ICD-10-CM | POA: Insufficient documentation

## 2022-08-23 DIAGNOSIS — I341 Nonrheumatic mitral (valve) prolapse: Secondary | ICD-10-CM | POA: Insufficient documentation

## 2022-08-23 DIAGNOSIS — I493 Ventricular premature depolarization: Secondary | ICD-10-CM | POA: Diagnosis present

## 2022-08-23 MED ORDER — APIXABAN 5 MG PO TABS: 5.0000 mg | ORAL_TABLET | Freq: Two times a day (BID) | ORAL | 3 refills | Status: DC

## 2022-08-23 NOTE — Progress Notes (Signed)
Refill for Eliquis 69m BID sent to [facility] per patient request. Qty 90/3 refills.

## 2022-08-23 NOTE — Patient Instructions (Signed)
Medication Instructions:  Your physician recommends that you continue on your current medications as directed. Please refer to the Current Medication list given to you today.  *If you need a refill on your cardiac medications before your next appointment, please call your pharmacy*  Lab Work: None ordered  Testing/Procedures: None ordered  Follow-Up: At [facility], you and your health needs are our priority.  As part of our continuing mission to provide you with exceptional heart care, we have created designated Provider Care Teams.  These Care Teams include your primary Cardiologist (physician) and Advanced Practice Providers (APPs -  Physician Assistants and Nurse Practitioners) who all work together to provide you with the care you need, when you need it.  Your next appointment:   6 month(s)  The format for your next appointment:   In Person  Provider:   You may see Melida Quitter, MD or one of the following Advanced Practice Providers on your designated Care Team:   Tommye Standard, [place] Legrand Como "[place]" [place], [name] [place], [place]

## 2022-08-23 NOTE — Progress Notes (Signed)
Cardiology Office Note:    Date:  08/23/2022   ID:  Jordan Payne, DOB 01-Nov-1955, MRN [id number]  PCP:  Lawerance Cruel, [place] Providers Cardiologist:  Fransico Him, MD Electrophysiologist:  Melida Quitter, MD     Referring MD: Lawerance Cruel, MD   Chief complaint: palpitations, fatigue, increased heart rate  History of Present Illness:    Jordan Payne is a 67 y.o. male with a hx of MVP s/p MV repair, HTN, dilated cardiomyopathy with recovered EF, frequent PVCs, and atrial fibrillation.  He underwent mitral valve repair in 2003. EF was moderately reduced, 40-45% in 2015 but had recovered to 50-55% by 2017.  He was diagnosed with AF in August 2023. ECGs from 8/15 and 9/11 2023 show AF. During the 9/11 visit, he reported increased fatigue, lightheadedness and palpitations described as "feeling of chaos" in his chest. He underwent DC cardioversion later in the month.  He remained in sinus rhythm for no more than 4 days.  He has a phone app that monitors snoring and noted that the night before the AF recurrent showed high uptick in snoring intensity.  He underwent ablation for persistent atrial fibrillation in November, 2023. He had frequent recurrence of AF during the procedure and required cardioversion 3 times. Since the ablation he has been doing very well. No symptoms of recurrence since recovering from the acute phase of the procedure. Functional capacity has improved.  Past Medical History:  Diagnosis Date   Agatston coronary artery calcium score greater than 400    coronary artery Ca score was 1274   Arthritis    DCM (dilated cardiomyopathy) ([place])    EF 40-45% by echo 2015.  EF 55% by echo 2017. EF 50-55% by echo 2021   HLD (hyperlipidemia)    Hypertension    MVP (mitral valve prolapse)    s/p repair   OSA (obstructive sleep apnea)    PAF (paroxysmal atrial fibrillation) ([facility])    S/P afib ablation>>followed by Dr. Myles Gip   Prostate cancer  [facility])    PVC's (premature ventricular contractions)    Tibialis posterior rupture, right, initial encounter    right foot    Past Surgical History:  Procedure Laterality Date   APPENDECTOMY     ATRIAL FIBRILLATION ABLATION N/A 05/23/2022   Procedure: ATRIAL FIBRILLATION ABLATION;  Surgeon: Melida Quitter, MD;  Location: [place] CV LAB;  Service: Cardiovascular;  Laterality: N/A;   CARDIOVERSION N/A 03/29/2022   Procedure: CARDIOVERSION;  Surgeon: Elouise Munroe, MD;  Location: [facility] ENDOSCOPY;  Service: Cardiovascular;  Laterality: N/A;   PROSTATE BIOPSY     robotic mitral valve repair     TENDON TRANSPLANT     PT tendon transplant and calcaneal osteotomy   TONSILLECTOMY      Current Medications: No outpatient medications have been marked as taking for the 08/23/22 encounter (Appointment) with Goldy Calandra, Yetta Barre, MD.     Allergies:   Patient has no known allergies.   Social History   Socioeconomic History   Marital status: Single    Spouse name: Not on file   Number of children: 0   Years of education: Not on file   Highest education level: Not on file  Occupational History   Occupation: Chief Financial Officer    Comment: retired  Tobacco Use   Smoking status: Never    Passive exposure: Past   Smokeless tobacco: Never  Vaping Use   Vaping Use: Never used  Substance and Sexual  Activity   Alcohol use: No    Alcohol/week: 0.0 standard drinks of alcohol   Drug use: No   Sexual activity: Yes  Other Topics Concern   Not on file  Social History Narrative   Not on file   Social Determinants of Health   Financial Resource Strain: Not on file  Food Insecurity: No Food Insecurity (05/23/2022)   Hunger Vital Sign    Worried About Running Out of Food in the Last Year: Never true    Ran Out of Food in the Last Year: Never true  Transportation Needs: No Transportation Needs (05/23/2022)   PRAPARE - Hydrologist (Medical): No    Lack of  Transportation (Non-Medical): No  Physical Activity: Not on file  Stress: Not on file  Social Connections: Not on file     Family History: The patient's family history includes Cancer in his mother; Deep vein thrombosis in his sister; Lymphoma in his maternal grandmother.  ROS:   Please see the history of present illness.    All other systems reviewed and are negative.  EKGs/Labs/Other Studies Reviewed:     TTE 11/2019 EF 50-55%, left atrium is moderately dilated  EKG:  Last EKG results: Today - AF with controlled rate   Recent Labs: 02/22/2022: Magnesium 2.4; TSH 2.500 05/03/2022: BUN 17; Creatinine, Ser 0.88; Potassium 4.7; Sodium 142 08/10/2022: ALT 13; Hemoglobin 15.7; Platelets 233    Risk Assessment/Calculations:    CHA2DS2-VASc Score = 2   This indicates a 2.2% annual risk of stroke. The patient's score is based upon: CHF History: 0 HTN History: 1 Diabetes History: 0 Stroke History: 0 Vascular Disease History: 0 Age Score: 1 Gender Score: 0    STOP-Bang Score:  6       Physical Exam:    VS:  There were no vitals taken for this visit.    Wt Readings from Last 3 Encounters:  08/09/22 177 lb 9.6 oz (80.6 kg)  07/25/22 175 lb (79.4 kg)  07/15/22 181 lb (82.1 kg)     GEN:  Well nourished, well developed in no acute distress CARDIAC: Irregular, no murmurs, rubs, gallops RESPIRATORY:  Normal work of breathing MUSCULOSKELETAL: no edema    ASSESSMENT & PLAN:    Persistent atrial fibrillation: Maintaining sinus rhythm s/p AF ablation. Doing very well. Exercise has increased. Exertional tolerance has improved.  Longterm use of anticoagulation: CHADS2VASC is 2. Continue eliquis. Cardiomyopathy: recovered Frequent PVCs MVP s/p repair OSA: about to start CPAP     Medication Adjustments/Labs and Tests Ordered: Current medicines are reviewed at length with the patient today.  Concerns regarding medicines are outlined above.  No orders of the defined types  were placed in this encounter.  No orders of the defined types were placed in this encounter.    Signed, Melida Quitter, MD  08/23/2022 12:16 PM    [place]

## 2022-08-24 NOTE — Addendum Note (Signed)
Addended by: Sharee Holster R on: 08/24/2022 04:58 PM   Modules accepted: Orders

## 2022-08-29 ENCOUNTER — Telehealth (HOSPITAL_COMMUNITY): Payer: Self-pay | Admitting: *Deleted

## 2022-08-29 NOTE — Telephone Encounter (Signed)
Reaching out to patient to offer assistance regarding upcoming cardiac imaging study; pt verbalizes understanding of appt date/time, parking situation and where to check in, pre-test NPO status and verified current allergies; name and call back number provided for further questions should they arise  Jordan Clement RN Navigator Cardiac Imaging [facility] [phone] office [phone] cell  Patient aware to avoid caffeine 12 hours prior to his cardiac PET scan.

## 2022-08-31 ENCOUNTER — Encounter (HOSPITAL_COMMUNITY)
Admission: RE | Admit: 2022-08-31 | Discharge: 2022-08-31 | Disposition: A | Discharge status: Home / Self Care | Payer: Medicare Other | Source: Ambulatory Visit | Attending: Cardiology | Admitting: Cardiology

## 2022-08-31 DIAGNOSIS — Z9889 Other specified postprocedural states: Secondary | ICD-10-CM

## 2022-08-31 DIAGNOSIS — R931 Abnormal findings on diagnostic imaging of heart and coronary circulation: Secondary | ICD-10-CM | POA: Diagnosis present

## 2022-08-31 DIAGNOSIS — I493 Ventricular premature depolarization: Secondary | ICD-10-CM | POA: Diagnosis not present

## 2022-08-31 DIAGNOSIS — I420 Dilated cardiomyopathy: Secondary | ICD-10-CM

## 2022-08-31 DIAGNOSIS — I4819 Other persistent atrial fibrillation: Secondary | ICD-10-CM | POA: Diagnosis not present

## 2022-08-31 DIAGNOSIS — I10 Essential (primary) hypertension: Secondary | ICD-10-CM

## 2022-08-31 DIAGNOSIS — E7800 Pure hypercholesterolemia, unspecified: Secondary | ICD-10-CM

## 2022-08-31 DIAGNOSIS — G4733 Obstructive sleep apnea (adult) (pediatric): Secondary | ICD-10-CM | POA: Diagnosis present

## 2022-08-31 LAB — NM PET CT CARDIAC PERFUSION MULTI W/ABSOLUTE BLOODFLOW
LV dias vol: 170 mL (ref 62–150)
LV sys vol: 88 mL
MBFR: 3.62
Nuc Rest EF: 45 %
Nuc Stress EF: 48 %
Peak HR: 80 {beats}/min
Rest HR: 68 {beats}/min
Rest MBF: 0.94 ml/g/min
Rest Nuclear Isotope Dose: 21.3 mCi
ST Depression (mm): 0 mm
Stress MBF: 3.4 ml/g/min
Stress Nuclear Isotope Dose: 21.3 mCi
TID: 0.97

## 2022-08-31 MED ORDER — REGADENOSON 0.4 MG/5ML IV SOLN
INTRAVENOUS | Status: AC
  Filled 2022-08-31: qty 5

## 2022-08-31 MED ORDER — RUBIDIUM RB82 GENERATOR (RUBYFILL)
21.3000 | PACK | Freq: Once | INTRAVENOUS | Status: AC
  Administered 2022-08-31: 21.3 via INTRAVENOUS

## 2022-08-31 MED ORDER — REGADENOSON 0.4 MG/5ML IV SOLN
0.4000 mg | Freq: Once | INTRAVENOUS | Status: AC
  Administered 2022-08-31: 0.4 mg via INTRAVENOUS

## 2022-09-07 ENCOUNTER — Telehealth: Payer: Self-pay

## 2022-09-07 NOTE — Telephone Encounter (Signed)
Results reviewed with patient who verbalizes understanding of normal stress test and known postoperative scarring in the anterior right upper and right middle lobes which was noted on high-resolution CT in January 24. Results forwarded to PCP and to Dr. Lake Bells.

## 2022-09-07 NOTE — Telephone Encounter (Signed)
-----   Message from Sueanne Margarita, MD sent at 09/01/2022  6:36 AM EST ----- Please let patient know that stress test was fine.  Noncardiac portion of chest CT showed postoperative scarring in the anterior right upper and right middle lobes.  This was noted on high-resolution CT in January 24.  Please forward a copy of this to Dr. Lake Bells and to patient's PCP

## 2022-09-16 ENCOUNTER — Telehealth: Payer: Self-pay | Admitting: Cardiovascular Disease

## 2022-09-16 NOTE — Telephone Encounter (Signed)
Spoke with patient who states that he did talk to Dr. Myles Gip about holding the Eliquis on 2/13 OV and Dr. Myles Gip said that he call hold for 3 days. I made him aware that Dr. Myles Gip didn't mention holding Eliquis at all in the last OV note. Patient states that he will call [facility] to fax our office a formal cardiac clearance. Patient thanked me for the call.

## 2022-09-16 NOTE — Telephone Encounter (Signed)
Pt c/o medication issue:  1. Name of Medication: apixaban (ELIQUIS) 5 MG TABS tablet   2. How are you currently taking this medication (dosage and times per day)? Take 1 tablet (5 mg total) by mouth 2 (two) times daily.   3. Are you having a reaction (difficulty breathing--STAT)? No  4. What is your medication issue? Pt saw Dr. Myles Gip and when speaking with Dr. Myles Gip, he was told he can stop taking his Eliquis for his upcoming procedure. Star from [facility] is requesting for documentation to show that Dr. Myles Gip approved the pt for holding Eliquis. Star requested it to be fax at [phone]

## 2022-09-16 NOTE — Telephone Encounter (Signed)
I have reviewed patient's chart and I do not see any documentation of a procedure or mention of holding Eliquis as requested in Dr. Karel Jarvis notes.  Preop callback team, please notify patient that he will need to have [facility] fax a request to [place] so that we can review.  Thank you, Sharyn Lull

## 2022-09-21 NOTE — Progress Notes (Unsigned)
Office Visit    Patient Name: Jordan Payne Date of Encounter: 09/22/2022  PCP:  Lawerance Cruel, [facility]  Cardiologist:  Fransico Him, MD  Advanced Practice Provider:  No care team member to display Electrophysiologist:  Melida Quitter, MD  }  HPI    Jordan Payne is a 67 y.o. male with a past medical history of MVP status post MV repair, HTN, dilated cardiomyopathy with recovered EF, frequent PVCs, and atrial fibrillation presents today for follow-up appointment.  Underwent mitral valve repair in 2003.  EF was moderately reduced, 40 to 45% in 2015 but recovered to 50 to 55% x 2017.  Diagnosed with AF August 2023.  EKG from 8/15 and 03/21/2022 showed AF.  During the 9/11 visit he reported increased fatigue, lightheadedness, and palpitations described as feeling of chaos in his chest.  Underwent DC cardioversion later that month.  He remained in sinus rhythm for no more than 4 days.  He has an app on his phone that monitors snoringAnd noted that the night before the AF and noted the night before the AF recurrence showed high uptake and snoring intensity.  Underwent ablation for persistent atrial fibrillation in November 2023.  Had frequent recurrence of AF during the procedure and required cardioversion 3 times.  Since the ablation he had been doing very well.  No symptoms of recurrence since recovering from the acute phase of the procedure.  Functional capacity has improved.  Today, he recently saw Dr. Myles Gip and he states things are good.  No issues since his ablation.  He was concerned today about his calcium score of 1237.  A PET scan was ordered and we went through those results as well.  Mildly reduced LVEF of 45% at that time.  No chest pain or shortness of breath.  He does a lot of walking without symptoms.  He is worried about some right-sided numbness that he is experiencing.  He states it started after his Crestor dose increased however  there were a few other medication changes at that time.  It seems to be getting better so he does not want to decrease his dose right now.  He has a follow-up lipid panel and LFTs April 2 scheduled.  He will let us know if this becomes more bothersome.  We discussed updating an echocardiogram for more accurate estimate of LVEF.  He has been on his CPAP for almost a month and has been tolerating well.  Occasionally has some issues with flow but overall tolerating well.   Reports no shortness of breath nor dyspnea on exertion. Reports no chest pain, pressure, or tightness. No edema, orthopnea, PND. Reports no palpitations.   Past Medical History    Past Medical History:  Diagnosis Date   Agatston coronary artery calcium score greater than 400    coronary artery Ca score was 1274   Arthritis    DCM (dilated cardiomyopathy) ([place])    EF 40-45% by echo 2015.  EF 55% by echo 2017. EF 50-55% by echo 2021   HLD (hyperlipidemia)    Hypertension    MVP (mitral valve prolapse)    s/p repair   OSA (obstructive sleep apnea)    PAF (paroxysmal atrial fibrillation) (HCC)    S/P afib ablation>>followed by Dr. Myles Gip   Prostate cancer [facility])    PVC's (premature ventricular contractions)    Tibialis posterior rupture, right, initial encounter    right foot   Past Surgical History:  Procedure Laterality Date   APPENDECTOMY     ATRIAL FIBRILLATION ABLATION N/A 05/23/2022   Procedure: ATRIAL FIBRILLATION ABLATION;  Surgeon: Melida Quitter, MD;  Location: [facility];  Service: Cardiovascular;  Laterality: N/A;   CARDIOVERSION N/A 03/29/2022   Procedure: CARDIOVERSION;  Surgeon: Elouise Munroe, MD;  Location: [facility] ENDOSCOPY;  Service: Cardiovascular;  Laterality: N/A;   PROSTATE BIOPSY     robotic mitral valve repair     TENDON TRANSPLANT     PT tendon transplant and calcaneal osteotomy   TONSILLECTOMY      Allergies  No Known Allergies   EKGs/Labs/Other Studies Reviewed:   The  following studies were reviewed today:  PET/CT cardiac perfusion 08/31/2022   Findings are consistent with no ischemia and no infarction. The study is low risk.   LV perfusion is normal. There is no evidence of ischemia. There is no evidence of infarction.   Rest left ventricular function is abnormal. Rest global function is mildly reduced. Rest EF: 45 %. Stress left ventricular function is abnormal. Stress global function is mildly reduced. Stress EF: 48 %. End diastolic cavity size is severely enlarged. End systolic cavity size is moderately enlarged. Correlate with echo. No evidence of transient ischemic dilation (TID) noted.   Myocardial blood flow was computed to be 0.84m/g/min at rest and 3.445mg/min at stress. Global myocardial blood flow reserve was 3.62 and was normal.   Coronary calcium was present on the attenuation correction CT images. Severe coronary calcifications were present. Coronary calcifications were present in the left anterior descending artery, left circumflex artery and right coronary artery distribution(s).  EKG:  EKG is not ordered today.    Recent Labs: 02/22/2022: Magnesium 2.4; TSH 2.500 05/03/2022: BUN 17; Creatinine, Ser 0.88; Potassium 4.7; Sodium 142 08/10/2022: ALT 13; Hemoglobin 15.7; Platelets 233  Recent Lipid Panel    Component Value Date/Time   CHOL 154 08/10/2022 0819   TRIG 134 08/10/2022 0819   HDL 38 (L) 08/10/2022 0819   CHOLHDL 4.1 08/10/2022 0819   LDLCALC 92 08/10/2022 0819    Risk Assessment/Calculations:   CHA2DS2-VASc Score = 2   This indicates a 2.2% annual risk of stroke. The patient's score is based upon: CHF History: 0 HTN History: 1 Diabetes History: 0 Stroke History: 0 Vascular Disease History: 0 Age Score: 1 Gender Score: 0     Home Medications   Current Meds  Medication Sig   acetaminophen (TYLENOL) 500 MG tablet Take 1,000 mg by mouth daily as needed for moderate pain.   apixaban (ELIQUIS) 5 MG TABS tablet Take 1  tablet (5 mg total) by mouth 2 (two) times daily.   Ascorbic Acid (VITAMIN C/ROSE HIPS TR) 1000 MG TBCR Take 1,000 mg by mouth in the morning.   carvedilol (COREG) 12.5 MG tablet Take 1 tablet (12.5 mg total) by mouth 2 (two) times daily with a meal.   Cholecalciferol (VITAMIN D3) 125 MCG (5000 UT) TABS Take 5,000 Units by mouth in the morning.   Coenzyme Q10 300 MG CAPS Take 100 mg by mouth daily.   fexofenadine (ALLEGRA) 180 MG tablet Take 180 mg by mouth daily.   fluticasone (FLONASE) 50 MCG/ACT nasal spray Place 2 sprays into both nostrils daily.   Glucosamine-Chondroit-Calcium (TRIPLE FLEX BONE & JOINT PO) Take 1 tablet by mouth in the morning. [place] Signature Triple Action Joint Health   lisinopril (ZESTRIL) 10 MG tablet Take 1 tablet (10 mg total) by mouth daily.   magnesium oxide (MAG-OX) 400 (  240 Mg) MG tablet Take 400 mg by mouth every evening.   Multiple Vitamin (MULTIVITAMIN WITH MINERALS) TABS tablet Take 1 tablet by mouth daily. [place] Signature Adult 50+ Mature Multi Vitamins & Minerals   rosuvastatin (CRESTOR) 20 MG tablet Take 1 tablet (20 mg total) by mouth daily.   TURMERIC PO Take 1,000 mg by mouth daily.     Review of Systems      All other systems reviewed and are otherwise negative except as noted above.  Physical Exam    VS:  BP 108/60   Pulse 74   Ht 5' 10.5" (1.791 m)   Wt 178 lb 6.4 oz (80.9 kg)   SpO2 97%   BMI 25.24 kg/m  , BMI Body mass index is 25.24 kg/m.  Wt Readings from Last 3 Encounters:  09/22/22 178 lb 6.4 oz (80.9 kg)  08/23/22 181 lb 3.2 oz (82.2 kg)  08/09/22 177 lb 9.6 oz (80.6 kg)     GEN: Well nourished, well developed, in no acute distress. HEENT: normal. Neck: Supple, no JVD, carotid bruits, or masses. Cardiac: RRR, no murmurs, rubs, or gallops. No clubbing, cyanosis, edema.  Radials/PT 2+ and equal bilaterally.  Respiratory:  Respirations regular and unlabored, clear to auscultation bilaterally. GI: Soft, nontender,  nondistended. MS: No deformity or atrophy. Skin: Warm and dry, no rash. Neuro:  Strength and sensation are intact. Psych: Normal affect.  Assessment & Plan    Preop Evaluation  Jordan Payne's perioperative risk of a major cardiac event is 0.4% according to the Revised Cardiac Risk Index (RCRI).  Therefore, he is at low risk for perioperative complications.   His functional capacity is good at >4 METs according to the Duke Activity Status Index (DASI). Recommendations: He will need an echo prior to clearance  Antiplatelet and/or Anticoagulation Recommendations: Eliquis hold will be up to PharmD  Hypertension -Well-controlled today 108/60 -Continue carvedilol 12.5 mg twice a day, lisinopril 10 mg daily -Continue to monitor blood pressure at home  Persistent atrial fibrillation -Continue Eliquis 5 mg twice daily -Continue carvedilol 12.5 mg twice daily -No issues since his ablation  Dilated cardiomyopathy -Left atrial size mildly dilated on last echocardiogram back in 2021 -No issues with atrial fibrillation status post ablation -Will order updated echocardiogram, last EF was 45%  MVP s/p repair -Update echocardiogram  OSA -Compliant with CPAP machine -He will follow-up with Dr. Radford Pax in April  6.  Hyperlipidemia -Crestor recently increased to 20 mg -Follow-up labs are scheduled for April 2 -Continue to monitor right-sided chest numbness, resolving        Disposition: Follow up 6 weeks with Fransico Him, MD or APP.  Signed, Elgie Collard, [name] 09/22/2022, 9:43 AM Jordan Payne

## 2022-09-22 ENCOUNTER — Ambulatory Visit: Payer: Medicare Other | Attending: Physician Assistant | Admitting: Physician Assistant

## 2022-09-22 ENCOUNTER — Telehealth: Payer: Self-pay | Admitting: *Deleted

## 2022-09-22 ENCOUNTER — Encounter: Payer: Self-pay | Admitting: Physician Assistant

## 2022-09-22 VITALS — BP 108/60 | HR 74 | Ht 70.5 in | Wt 178.4 lb

## 2022-09-22 DIAGNOSIS — I10 Essential (primary) hypertension: Secondary | ICD-10-CM

## 2022-09-22 DIAGNOSIS — I4819 Other persistent atrial fibrillation: Secondary | ICD-10-CM | POA: Diagnosis present

## 2022-09-22 DIAGNOSIS — E7800 Pure hypercholesterolemia, unspecified: Secondary | ICD-10-CM | POA: Diagnosis present

## 2022-09-22 DIAGNOSIS — R931 Abnormal findings on diagnostic imaging of heart and coronary circulation: Secondary | ICD-10-CM | POA: Diagnosis present

## 2022-09-22 DIAGNOSIS — G4719 Other hypersomnia: Secondary | ICD-10-CM

## 2022-09-22 DIAGNOSIS — Z9889 Other specified postprocedural states: Secondary | ICD-10-CM

## 2022-09-22 DIAGNOSIS — I420 Dilated cardiomyopathy: Secondary | ICD-10-CM | POA: Diagnosis present

## 2022-09-22 DIAGNOSIS — G4733 Obstructive sleep apnea (adult) (pediatric): Secondary | ICD-10-CM

## 2022-09-22 NOTE — Patient Instructions (Signed)
Medication Instructions:  Your physician recommends that you continue on your current medications as directed. Please refer to the Current Medication list given to you today.  *If you need a refill on your cardiac medications before your next appointment, please call your pharmacy*   Lab Work: Fasting lipid panel and lft's as scheduled on 10/11/22 If you have labs (blood work) drawn today and your tests are completely normal, you will receive your results only by: [place] (if you have MyChart) OR A paper copy in the mail If you have any lab test that is abnormal or we need to change your treatment, we will call you to review the results.   Testing/Procedures: Your physician has requested that you have an echocardiogram. Echocardiography is a painless test that uses sound waves to create images of your heart. It provides your doctor with information about the size and shape of your heart and how well your heart's chambers and valves are working. This procedure takes approximately one hour. There are no restrictions for this procedure. Please do NOT wear cologne, perfume, aftershave, or lotions (deodorant is allowed). Please arrive 15 minutes prior to your appointment time.    Follow-Up: At [facility], you and your health needs are our priority.  As part of our continuing mission to provide you with exceptional heart care, we have created designated Provider Care Teams.  These Care Teams include your primary Cardiologist (physician) and Advanced Practice Providers (APPs -  Physician Assistants and Nurse Practitioners) who all work together to provide you with the care you need, when you need it.   Your next appointment:   10/20/22 at 8:20 AM mychart video visit with Dr Radford Pax

## 2022-09-22 NOTE — Telephone Encounter (Signed)
   Pre-operative Risk Assessment    Patient Name: Jordan Payne  DOB: March 15, 1956 MRN: [id number]      Request for Surgical Clearance    Procedure:   Prostate Biopsy  Date of Surgery:  Clearance 11/09/22                                 Surgeon:  Dr. Irine Seal Surgeon's Group or Practice Name:  [facility] Phone number:  [phone] Fax number:  [phone]   Type of Clearance Requested:   - Medical  - Pharmacy:  Hold Apixaban (Eliquis) 3 days prior.    Type of Anesthesia:  Not Indicated   Additional requests/questions:    Signed, Greer Ee   09/22/2022, 3:15 PM

## 2022-09-22 NOTE — Telephone Encounter (Signed)
Pt saw Nicholes Rough, [facility] today for pre op clearance we just received. Per PAC to send clearance to pre op Pharm-D for recommendations on Eliquis. PAC has also ordered an echo for the pt have done before signing off on clearance. I will update the requesting of this as well.   Once the pt has been cleared, PAC will have her nurse/cma fax over clearance notes with recommendations.

## 2022-09-23 NOTE — Telephone Encounter (Signed)
Patient with diagnosis of PAF(paroxysmal atrial fibrillation) on Eliquis for anticoagulation.    Procedure: Prostate Biopsy  Date of procedure: 11/09/2022   CHA2DS2-VASc Score = 3   This indicates a 3.2% annual risk of stroke. The patient's score is based upon: CHF History: 0 HTN History: 1 Diabetes History: 0 Stroke History: 0 Vascular Disease History: 1 Age Score: 1 Gender Score: 0     CrCl 89 ml/min Platelet count 233  Per office protocol, patient can hold Eliquis for 3 days prior to procedure.     **This guidance is not considered finalized until pre-operative APP has relayed final recommendations.**

## 2022-10-01 IMAGING — MR MR PROSTATE WO/W CM
12 series · 48 of 48 positions shown · IV contrast (multihance)
Comparison: None

CLINICAL DATA: Elevated PSA in a 65-year-old male. PSA of 1.92 post
recent biopsy in July 2020 which showed multiple sites of
prostate adenocarcinoma, Gleason 6 disease.

EXAM:
MR PROSTATE WITHOUT AND WITH CONTRAST
TECHNIQUE: Multiplanar multisequence MRI images were obtained of the pelvis
centered about the prostate. Pre and post contrast images were
obtained.
CONTRAST:  16mL MULTIHANCE GADOBENATE DIMEGLUMINE 529 MG/ML IV SOLN

[Series 3: T2 · coronal · 3.0mm · 0.56mm/px · 1 of 23 slices shown (1 of 3)]
[im 1/23]
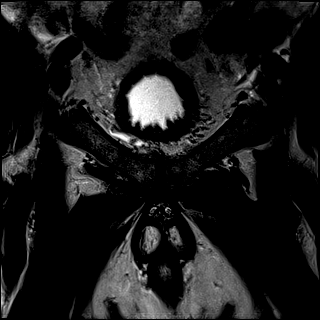

[Series 4: T1 · axial · 5.0mm · 1.25mm/px · 1 of 88 slices shown]
[im 1/88]
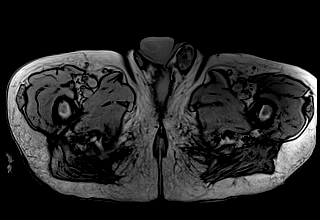

[Series 5: DWI · axial · 3.0mm · 1.75mm/px · 1 of 81 slices shown (1 of 3)]
[im 1/81]
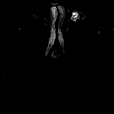

[Series 6: DWI · axial · 3.0mm · 1.75mm/px · 1 of 27 slices shown (2 of 3)]
[im 1/27]
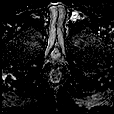

[Series 7: DWI · axial · 3.0mm · 1.75mm/px · 1 of 27 slices shown (3 of 3)]
[im 1/27]
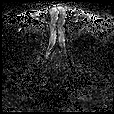

[Series 8: T2 · axial · 3.0mm · 0.56mm/px · 1 of 27 slices shown (2 of 3)]
[im 1/27]
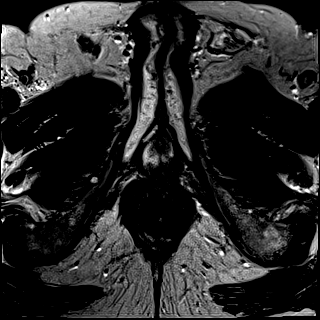

[Series 9: T2 · axial · 1.0mm · 1.04mm/px · z∈[-13,+66]mm · 2 of 80 slices shown (3 of 3)]
[im 1/80]
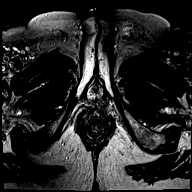
[im 80/80]
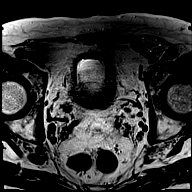

[Series 10: pre t1_twist_tra_dyn · axial · non-contrast · 3.5mm · 0.83mm/px · 1 of 24 slices shown]
[im 1/24]
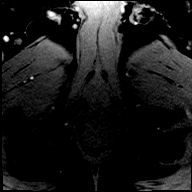

[Series 11: post t1_twist_tra_dyn-copy center · axial · non-contrast · 3.5mm · 0.83mm/px · z∈[-14,+66]mm · 18 of 720 slices shown]
[im 1/720]
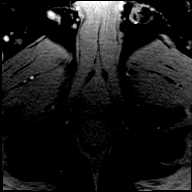
[im 43/720]
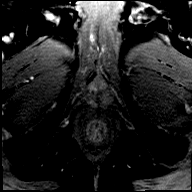
[im 85/720]
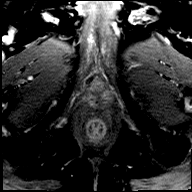
[im 127/720]
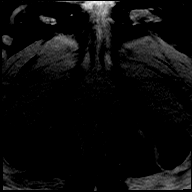
[im 170/720]
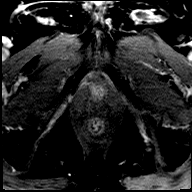
[im 212/720]
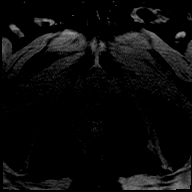
[im 254/720]
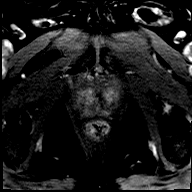
[im 297/720]
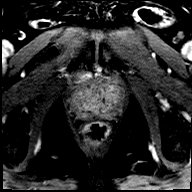
[im 339/720]
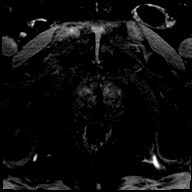
[im 381/720]
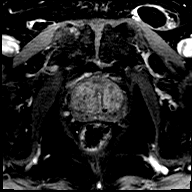
[im 423/720]
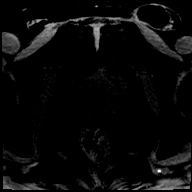
[im 466/720]
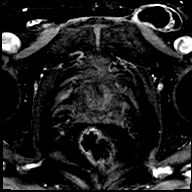
[im 508/720]
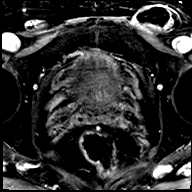
[im 550/720]
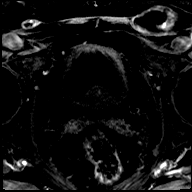
[im 593/720]
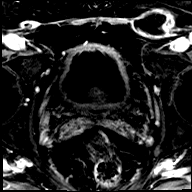
[im 635/720]
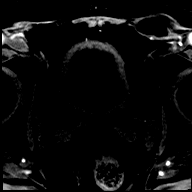
[im 677/720]
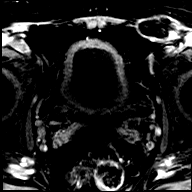
[im 720/720]
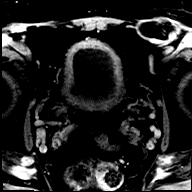

[Series 12: post t1_twist_tra_dyn-copy cent_sub · axial · 3.5mm · 0.83mm/px · z∈[-14,+66]mm · 17 of 690 slices shown]
[im 1/690]
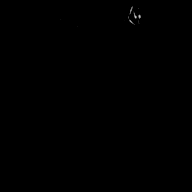
[im 44/690]
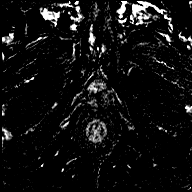
[im 87/690]
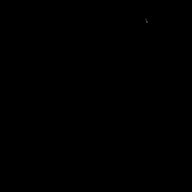
[im 130/690]
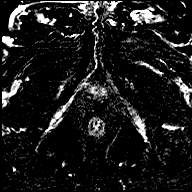
[im 173/690]
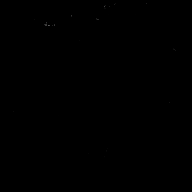
[im 216/690]
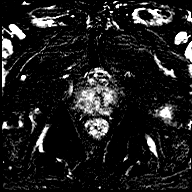
[im 259/690]
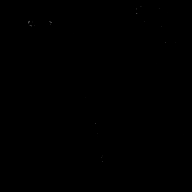
[im 302/690]
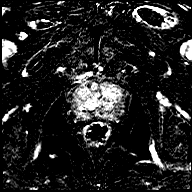
[im 345/690]
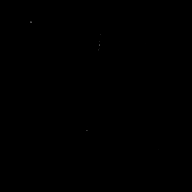
[im 388/690]
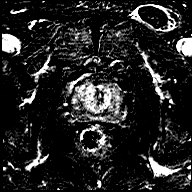
[im 431/690]
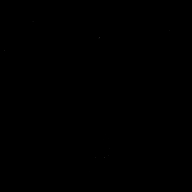
[im 474/690]
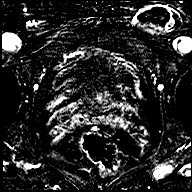
[im 517/690]
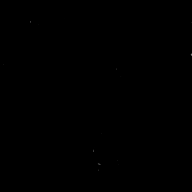
[im 560/690]
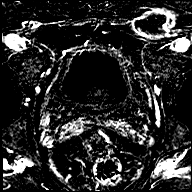
[im 603/690]
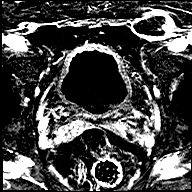
[im 646/690]
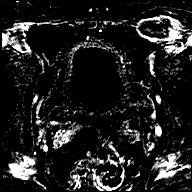
[im 690/690]
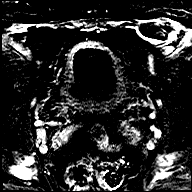

[Series 13: t1_vibe_dixon_tra_f · axial · 2.5mm · 0.91mm/px · z∈[-30,+167]mm · 2 of 80 slices shown]
[im 1/80]
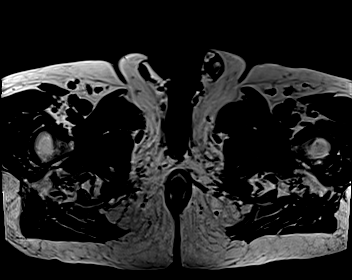
[im 80/80]
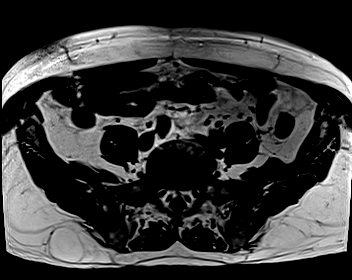

[Series 14: t1_vibe_dixon_tra_w · axial · 2.5mm · 0.91mm/px · z∈[-30,+167]mm · 2 of 80 slices shown]
[im 1/80]
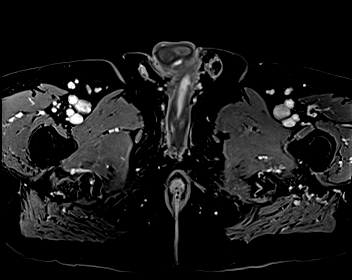
[im 80/80]
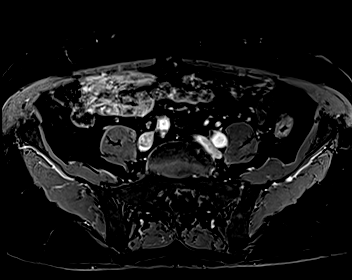

[48 of 48 positions shown; findings below may reference images not displayed]

FINDINGS: Prostate: Signs of BPH and sequela of prior prostatitis throughout
the gland. Gland is not particularly enlarged.

Transitional zone: Expansion of the transitional zone. Mildly
heterogeneous area of T2 hypointensity along the LEFT lateral
transitional zone along the surgical capsule amidst BPH nodules
potentially partially contained within a BPH nodule. No focal area
of restricted diffusion with asymmetry. PIRADS category 3 lesion in
the LEFT mid gland transitional zone. This measures 16 x 7 mm

Peripheral zone: Diffuse linear and wedge-shaped areas throughout
the peripheral zone. No signs of high-risk lesion.

Volume: 42 cc

Transcapsular spread:  Absent

Seminal vesicle involvement: Absent

Neurovascular bundle involvement: Absent

Pelvic adenopathy: Absent

Bone metastasis: Absent

Other findings: Colonic diverticulosis. Moderate size LEFT fat
containing inguinal hernia.
IMPRESSION: Signs of BPH and sequela of prior prostatitis.

PIRADS category 3 lesion in the LEFT lateral mid gland transitional
zone. Potentially associated with stroma rich BPH nodule.

## 2022-10-06 NOTE — Telephone Encounter (Signed)
Spoke with Lovestar at [facility] and she has been made aware that pt's Echocardiogram is scheduled for 10/19/22 and clearance will be addressed at that time.  I did resend her the information.

## 2022-10-06 NOTE — Telephone Encounter (Signed)
Office calling for update. Please advise  

## 2022-10-11 ENCOUNTER — Ambulatory Visit: Payer: Medicare Other | Attending: Cardiology

## 2022-10-11 DIAGNOSIS — E7800 Pure hypercholesterolemia, unspecified: Secondary | ICD-10-CM

## 2022-10-12 LAB — LIPID PANEL
Chol/HDL Ratio: 3.4 ratio (ref 0.0–5.0)
Cholesterol, Total: 137 mg/dL (ref 100–199)
HDL: 40 mg/dL (ref >39)
LDL Chol Calc (NIH): 75 mg/dL (ref 0–99)
Triglycerides: 124 mg/dL (ref 0–149)
VLDL Cholesterol Cal: 22 mg/dL (ref 5–40)

## 2022-10-12 LAB — ALT: ALT: 14 IU/L (ref 0–44)

## 2022-10-13 ENCOUNTER — Telehealth: Payer: Self-pay

## 2022-10-13 DIAGNOSIS — E785 Hyperlipidemia, unspecified: Secondary | ICD-10-CM

## 2022-10-13 DIAGNOSIS — Z79899 Other long term (current) drug therapy: Secondary | ICD-10-CM

## 2022-10-13 MED ORDER — ROSUVASTATIN CALCIUM 40 MG PO TABS: 40.0000 mg | ORAL_TABLET | Freq: Every day | ORAL | 3 refills | Status: DC

## 2022-10-13 NOTE — Telephone Encounter (Signed)
Called to discuss cholesterol lab results with patient. Patient verbalizes understanding that cholesterol numbers are improved but not at goal. Patient agrees to plan to increase crestor and repeat labs in 6 weeks. Meds/labs ordered and labs scheduled. Education provided regarding taking medication at night so that any muscle aches are more tolerable.

## 2022-10-13 NOTE — Telephone Encounter (Signed)
-----   Message from Sueanne Margarita, MD sent at 10/12/2022 10:35 AM EDT ----- Please let patient know that labs were normal.  Continue current medical therapy.

## 2022-10-19 ENCOUNTER — Other Ambulatory Visit (HOSPITAL_COMMUNITY): Payer: Medicare Other

## 2022-10-19 ENCOUNTER — Ambulatory Visit (HOSPITAL_COMMUNITY): Payer: Medicare Other | Attending: Internal Medicine

## 2022-10-19 DIAGNOSIS — I10 Essential (primary) hypertension: Secondary | ICD-10-CM | POA: Diagnosis present

## 2022-10-19 DIAGNOSIS — Z9889 Other specified postprocedural states: Secondary | ICD-10-CM | POA: Diagnosis present

## 2022-10-19 LAB — ECHOCARDIOGRAM COMPLETE
Area-P 1/2: 3.17 cm2
S' Lateral: 4.6 cm

## 2022-10-19 MED ORDER — PERFLUTREN LIPID MICROSPHERE
1.0000 mL | INTRAVENOUS | Status: AC | PRN
  Administered 2022-10-19: 1 mL via INTRAVENOUS

## 2022-10-20 ENCOUNTER — Telehealth: Payer: Self-pay | Admitting: *Deleted

## 2022-10-20 ENCOUNTER — Encounter: Payer: Self-pay | Admitting: Cardiology

## 2022-10-20 ENCOUNTER — Ambulatory Visit: Payer: Medicare Other | Attending: Cardiology | Admitting: Cardiology

## 2022-10-20 VITALS — BP 108/69 | HR 69 | Ht 70.0 in | Wt 177.0 lb

## 2022-10-20 DIAGNOSIS — R0683 Snoring: Secondary | ICD-10-CM

## 2022-10-20 DIAGNOSIS — I10 Essential (primary) hypertension: Secondary | ICD-10-CM

## 2022-10-20 DIAGNOSIS — G4719 Other hypersomnia: Secondary | ICD-10-CM

## 2022-10-20 DIAGNOSIS — G4733 Obstructive sleep apnea (adult) (pediatric): Secondary | ICD-10-CM | POA: Diagnosis present

## 2022-10-20 NOTE — Patient Instructions (Signed)
Medication Instructions:  Your physician recommends that you continue on your current medications as directed. Please refer to the Current Medication list given to you today.  *If you need a refill on your cardiac medications before your next appointment, please call your pharmacy*   Lab Work: None.  If you have labs (blood work) drawn today and your tests are completely normal, you will receive your results only by: MyChart Message (if you have MyChart) OR A paper copy in the mail If you have any lab test that is abnormal or we need to change your treatment, we will call you to review the results.   Testing/Procedures: None.   Follow-Up: At [facility], you and your health needs are our priority.  As part of our continuing mission to provide you with exceptional heart care, we have created designated Provider Care Teams.  These Care Teams include your primary Cardiologist (physician) and Advanced Practice Providers (APPs -  Physician Assistants and Nurse Practitioners) who all work together to provide you with the care you need, when you need it.  We recommend signing up for the patient portal called "MyChart".  Sign up information is provided on this After Visit Summary.  MyChart is used to connect with patients for Virtual Visits (Telemedicine).  Patients are able to view lab/test results, encounter notes, upcoming appointments, etc.  Non-urgent messages can be sent to your provider as well.   To learn more about what you can do with MyChart, go to [email or web address].    Your next appointment:   1 year(s)  Provider:   Armanda Magic, MD     Other Instructions Dr. Mayford Knife has changed your auto CPAP settings to 4-15cm H2O.

## 2022-10-20 NOTE — Progress Notes (Signed)
Virtual Visit via Video Note   Because of Jordan Payne's co-morbid illnesses, he is at least at moderate risk for complications without adequate follow up.  This format is felt to be most appropriate for this patient at this time.  All issues noted in this document were discussed and addressed.  A limited physical exam was performed with this format.  Please refer to the patient's chart for his consent to telehealth for [facility].   Date:  10/23/2022   ID:  Jordan Payne, DOB October 17, 1955, MRN [id number] The patient was identified using 2 identifiers.  Patient Location: Home Provider Location: Office/Clinic   PCP:  Daisy Florooss, Charles Alan, MD   [facility] Providers Cardiologist:  Armanda Magicraci Sarai January, MD Electrophysiologist:  Maurice SmallAugustus E Mealor, MD     Evaluation Performed:  Follow-Up Visit  Chief Complaint:  CAD, HTN, HLD, PAF  History of Present Illness:    Jordan Payne is a 67 y.o. male  male with a hx of MVP s/p MV repair, PAF followed by Dr. Nelly LaurenceMealor and afib clinic, HTN and DCM (EF normalized to 55% on echo 09/23/2015). He is here today for followup and is doing well.  He denies any chest pain or pressure, SOB, DOE, PND, orthopnea, LE edema, dizziness or syncope. He has only had 1 episode of palpitations after eating some cheesecake. He is compliant with his meds and is tolerating meds with no SE.     He was sent for a sleep study due to PAF which showed severe OSA with an AHI of 32/hr and no significant central events. He underwent CPAP titration to 16cm H2O.  At last OV he had not started CPAP yet because he was concerned that he should be on an auto PAP instead of a set pressure.  At that OV I ordered auto CPAP from 4 to 20cm H2O.  He is now back for followup.   He is doing well with his PAP device and thinks that He has gotten used to it.  He tolerates the mask but feels he is getting too much pressure.  He denies any significant mouth or nasal dryness or nasal  congestion.    Past Medical History:  Diagnosis Date   Agatston coronary artery calcium score greater than 400    coronary artery Ca score was 1274   Arthritis    DCM (dilated cardiomyopathy)    EF 40-45% by echo 2015.  EF 55% by echo 2017. EF 50-55% by echo 2021   HLD (hyperlipidemia)    Hypertension    MVP (mitral valve prolapse)    s/p repair   OSA (obstructive sleep apnea)    PAF (paroxysmal atrial fibrillation)    S/P afib ablation>>followed by Dr. Nelly LaurenceMealor   Prostate cancer    PVC's (premature ventricular contractions)    Tibialis posterior rupture, right, initial encounter    right foot   Past Surgical History:  Procedure Laterality Date   APPENDECTOMY     ATRIAL FIBRILLATION ABLATION N/A 05/23/2022   Procedure: ATRIAL FIBRILLATION ABLATION;  Surgeon: Maurice SmallMealor, Augustus E, MD;  Location: [facility] INVASIVE CV LAB;  Service: Cardiovascular;  Laterality: N/A;   CARDIOVERSION N/A 03/29/2022   Procedure: CARDIOVERSION;  Surgeon: Parke PoissonAcharya, Gayatri A, MD;  [facility];  Service: Cardiovascular;  Laterality: N/A;   PROSTATE BIOPSY     robotic mitral valve repair     TENDON TRANSPLANT     PT tendon transplant and calcaneal osteotomy   TONSILLECTOMY  Current Meds  Medication Sig   acetaminophen (TYLENOL) 500 MG tablet Take 1,000 mg by mouth daily as needed for moderate pain.   apixaban (ELIQUIS) 5 MG TABS tablet Take 1 tablet (5 mg total) by mouth 2 (two) times daily.   Ascorbic Acid (VITAMIN C/ROSE HIPS TR) 1000 MG TBCR Take 1,000 mg by mouth in the morning.   carvedilol (COREG) 12.5 MG tablet Take 1 tablet (12.5 mg total) by mouth 2 (two) times daily with a meal.   Cholecalciferol (VITAMIN D3) 125 MCG (5000 UT) TABS Take 5,000 Units by mouth in the morning.   Coenzyme Q10 300 MG CAPS Take 100 mg by mouth daily.   fexofenadine (ALLEGRA) 180 MG tablet Take 180 mg by mouth daily.   fluticasone (FLONASE) 50 MCG/ACT nasal spray Place 2 sprays into both nostrils daily.    Glucosamine-Chondroit-Calcium (TRIPLE FLEX BONE & JOINT PO) Take 1 tablet by mouth in the morning. [place] Signature Triple Action Joint Health   lisinopril (ZESTRIL) 10 MG tablet Take 1 tablet (10 mg total) by mouth daily.   magnesium oxide (MAG-OX) 400 (240 Mg) MG tablet Take 400 mg by mouth every evening.   Multiple Vitamin (MULTIVITAMIN WITH MINERALS) TABS tablet Take 1 tablet by mouth daily. [place] Signature Adult 50+ Mature Multi Vitamins & Minerals   rosuvastatin (CRESTOR) 40 MG tablet Take 1 tablet (40 mg total) by mouth daily.   TURMERIC PO Take 1,000 mg by mouth daily.     Allergies:   Patient has no known allergies.   Social History   Tobacco Use   Smoking status: Never    Passive exposure: Past   Smokeless tobacco: Never  Vaping Use   Vaping Use: Never used  Substance Use Topics   Alcohol use: No    Alcohol/week: 0.0 standard drinks of alcohol   Drug use: No     Family Hx: The patient's family history includes Cancer in his mother; Deep vein thrombosis in his sister; Lymphoma in his maternal grandmother.  ROS:   Please see the history of present illness.     All other systems reviewed and are negative.   Prior CV studies:   The following studies were reviewed today:  PAP compliance download  Labs/Other Tests and Data Reviewed:    EKG:  No ECG reviewed.  Recent Labs: 02/22/2022: Magnesium 2.4; TSH 2.500 05/03/2022: BUN 17; Creatinine, Ser 0.88; Potassium 4.7; Sodium 142 08/10/2022: Hemoglobin 15.7; Platelets 233 10/11/2022: ALT 14   Recent Lipid Panel Lab Results  Component Value Date/Time   CHOL 137 10/11/2022 07:39 AM   TRIG 124 10/11/2022 07:39 AM   HDL 40 10/11/2022 07:39 AM   CHOLHDL 3.4 10/11/2022 07:39 AM   LDLCALC 75 10/11/2022 07:39 AM    Wt Readings from Last 3 Encounters:  10/20/22 177 lb (80.3 kg)  09/22/22 178 lb 6.4 oz (80.9 kg)  08/23/22 181 lb 3.2 oz (82.2 kg)     Risk Assessment/Calculations:    CHA2DS2-VASc Score = 3    This indicates a 3.2% annual risk of stroke. The patient's score is based upon: CHF History: 0 HTN History: 1 Diabetes History: 0 Stroke History: 0 Vascular Disease History: 1 Age Score: 1 Gender Score: 0     STOP-Bang Score:  6      Objective:    Vital Signs:  BP 108/69   Pulse 69   Ht  (1.778 m)   Wt 177 lb (80.3 kg)   BMI 25.40 kg/m  VITAL SIGNS:  reviewed GEN:  no acute distress EYES:  sclerae anicteric, EOMI - Extraocular Movements Intact RESPIRATORY:  normal respiratory effort, symmetric expansion CARDIOVASCULAR:  no peripheral edema SKIN:  no rash, lesions or ulcers. MUSCULOSKELETAL:  no obvious deformities. NEURO:  alert and oriented x 3, no obvious focal deficit PSYCH:  normal affect  ASSESSMENT & PLAN:    OSA - The patient is tolerating PAP therapy well without any problems. The PAP download performed by his DME was personally reviewed and interpreted by me today and showed an AHI of 0.9/hr on auto CPAP from 4-20 cm H2O with 100% compliance in using more than 4 hours nightly.  The patient has been using and benefiting from PAP use and will continue to benefit from therapy.  -I will get change the auto CPAP down to 4-15cm H2O>he will let me know how he is doing on reduced pressure  2.  HTN -BP controlled on exam -continue prescription drug management with Carvedilol 12.5mg  BID and Lisinopril 10mg  daily with PRN refills    Time:   Today, I have spent 15 minutes with the patient with telehealth technology discussing the above problems.     Medication Adjustments/Labs and Tests Ordered: Current medicines are reviewed at length with the patient today.  Concerns regarding medicines are outlined above.   Tests Ordered: No orders of the defined types were placed in this encounter.   Medication Changes: No orders of the defined types were placed in this encounter.   Follow Up:  In Person in 1 year(s)  Signed, Armanda Magic, MD  10/23/2022 6:32 PM     [facility]

## 2022-10-20 NOTE — Telephone Encounter (Signed)
-----   Message from Luellen Pucker, RN sent at 10/20/2022  8:45 AM EDT ----- Regarding: Cpap orders Per Dr. Mayford Knife, change auto CPAP to 4-15cm H2O and get a download in 4 weeks.  Thanks!

## 2022-10-20 NOTE — Telephone Encounter (Signed)
Order placed to [facility] via community message. 

## 2022-11-25 ENCOUNTER — Ambulatory Visit: Payer: Medicare Other | Attending: Cardiology

## 2022-11-25 DIAGNOSIS — Z79899 Other long term (current) drug therapy: Secondary | ICD-10-CM

## 2022-11-25 DIAGNOSIS — E785 Hyperlipidemia, unspecified: Secondary | ICD-10-CM

## 2022-11-26 LAB — LIPID PANEL
Chol/HDL Ratio: 3.3 ratio (ref 0.0–5.0)
Cholesterol, Total: 127 mg/dL (ref 100–199)
HDL: 39 mg/dL — ABNORMAL LOW (ref >39)
LDL Chol Calc (NIH): 70 mg/dL (ref 0–99)
Triglycerides: 94 mg/dL (ref 0–149)
VLDL Cholesterol Cal: 18 mg/dL (ref 5–40)

## 2022-11-26 LAB — ALT: ALT: 21 IU/L (ref 0–44)

## 2022-11-30 ENCOUNTER — Telehealth: Payer: Self-pay | Admitting: Cardiology

## 2022-11-30 NOTE — Telephone Encounter (Signed)
Message received in [facility] Triage pt called back.    Pt wanted to give up date, and request an appointment with a provider.  Pt stated since increasing Crestor to 40 mg, Pt has been having increased headaches, and muscle weakness, and wanted to talk to a [facility] provider about this.   Pt also stated that on Monday, 5/20, his watch stated possible Atrial Fib?  Per Pt, his HR was 63 bpm. He has not had a repeat episode of this on his watch.   Pt states that his BP has been slowly trending upward; Pt has logged his BP readings, and shared this weeks BP's, they are as follows: 112/72, 116/80, 133/88, 134/85, and today 5/22, 133/92 with HR of 65 bpm.  His concern is that he has had some intermittent dizziness, in the mornings, and wanted to be seen by a provider per this new symptom.  HR typically run ( 65 to 72 bpm.) After a lengthy conversation discussing multiple symptoms, and increasing BP, I agreed with Pt, that an appointment with a [facility] provider would be beneficial.  Ms. Eligha Bridegroom NP had an opening at 1005 am on 5/23.  Pt advised to bring BP log to appointment.   Pt appreciated Ms. Swinyer NP seeing him, and stated that she has assessed his concerns in the past.  I will message Ms. Swinyer NP to give her a heads up on Pt concerns prior to appointment tomorrow.

## 2022-11-30 NOTE — Telephone Encounter (Signed)
Patient is requesting call back from [place], RN in regards to previous conversation about lab results and to give update.

## 2022-12-01 ENCOUNTER — Encounter: Payer: Self-pay | Admitting: Nurse Practitioner

## 2022-12-01 ENCOUNTER — Ambulatory Visit: Payer: Medicare Other | Attending: Nurse Practitioner | Admitting: Nurse Practitioner

## 2022-12-01 VITALS — BP 110/74 | HR 72 | Ht 70.0 in | Wt 179.0 lb

## 2022-12-01 DIAGNOSIS — I2510 Atherosclerotic heart disease of native coronary artery without angina pectoris: Secondary | ICD-10-CM | POA: Diagnosis present

## 2022-12-01 DIAGNOSIS — I10 Essential (primary) hypertension: Secondary | ICD-10-CM | POA: Insufficient documentation

## 2022-12-01 DIAGNOSIS — Z9889 Other specified postprocedural states: Secondary | ICD-10-CM | POA: Diagnosis present

## 2022-12-01 DIAGNOSIS — I341 Nonrheumatic mitral (valve) prolapse: Secondary | ICD-10-CM | POA: Diagnosis not present

## 2022-12-01 DIAGNOSIS — I4819 Other persistent atrial fibrillation: Secondary | ICD-10-CM | POA: Insufficient documentation

## 2022-12-01 DIAGNOSIS — R002 Palpitations: Secondary | ICD-10-CM | POA: Diagnosis not present

## 2022-12-01 DIAGNOSIS — I420 Dilated cardiomyopathy: Secondary | ICD-10-CM | POA: Diagnosis present

## 2022-12-01 DIAGNOSIS — E785 Hyperlipidemia, unspecified: Secondary | ICD-10-CM | POA: Insufficient documentation

## 2022-12-01 DIAGNOSIS — Z7901 Long term (current) use of anticoagulants: Secondary | ICD-10-CM | POA: Insufficient documentation

## 2022-12-01 NOTE — Progress Notes (Signed)
Cardiology Office Note:    Date:  12/01/2022   ID:  Jordan Payne, DOB 18-Jul-1955, MRN [id number]  PCP:  Daisy Floro, MD   [facility] Providers Cardiologist:  Armanda Magic, MD Electrophysiologist:  Maurice Small, MD     Referring MD: Daisy Floro, MD   Chief Complaint: blood pressure concerns  History of Present Illness:    Jordan Payne is a very pleasant 67 y.o. male with a hx of MVP s/p MV repair, HTN, persistent atrial fibrillation s/p ablation 07/2022, dilated cardiomyopathy with normalized EF, OSA on CPAP, and frequent PVCs.  History of MVP s/p MV repair 2003 with reported normal coronary arteries at the time. LVEF 40-45% on echo 2015, improved to 50-55% on echo 2017, 2021.   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   Seen by me on 02/22/22 for evaluation of palpitations, elevated heart rate, and saw abnormal ekg on BP monitor x 1 week. Wonders if steroids or Etoh contributed.  Volunteers as DJ on weekends. Stopped walking 3 miles daily during illness but recently resumed with more SOB with exertion. Previously underwent sleep study which was negative for sleep apnea. Monitors home HR and BP. BP on occasion has been low - 95/65. HR has been running > 90 bpm frequently. He denies lightheadedness, presyncope, syncope.   Seen in clinic by me on 03/21/22. Reported vague symptoms. Occasional  lightheadedness about 1 hour after eating. Often is fatigued when he wakes up and fatigued in the afternoon, but this seems to be getting better. Walks 1.8 to 5.3 miles daily and has to start out from his home on an incline. Feels mild chest tightness when starting up the hill but is able to complete his walk without any concerning symptoms. "Feeling of chaos" in chest improving. Did an experiment of zero  alcohol and caffeine for 2 weeks  with no improvement in symptoms. Risks of cardioversion were discussed and he elected to proceed. Underwent successful DCCV on 03/29/22.   Seen by me on 05/06/22 with a list of questions to review. Carvedilol as reduced to 6.25 mg at his cardioversion by Dr. Jacques Navy due to recent soft BP readings. He is feeling well on his current doses of medications. Had about 6 days of NSR and then return of A-fib. Appointment with Dr. Nelly Laurence on 05/03/2022 to proceed with A-fib ablation which is scheduled for 05/23/2022. Dr. Nelly Laurence also ordered a home sleep study. Remains very active walking 2 to 4 miles daily as well as work around his home. Continues to have intermittent chest tightness not associated with n/v or diaphoresis but is associated with SOB on occasion. No orthopnea or PND. Reports that he will need a prostate biopsy in the future.   He underwent successful EP study and radiofrequency catheter ablation of a fib on 05/23/22 by Dr. Nelly Laurence.    Diagnosed with severe OSA with AHI of 32/hr and no significant central events. He underwent CPAP titration. Seen by Dr. Mayford Knife 08/09/22. Advised to undergo cardiac PET for ischemia evaluation which was low risk, no evidence of ischemia or infarction. It revealed mildly reduced LVEF 45%.   Seen in clinic by [name], PA on 09/22/22. Coronary CT for pulm vein mapping revealed calcium score of 1247 (93rd percentile) for prostate biopsy clearance. Echo was ordered for correlation of EF and revealed  LVEF 50-55%, indeterminate diastolic parameters, global HK, moderately dilated LA, normal MV function with trivial regurg, no significant change from previous echo 11/2019.  Today, he is here for evaluation of concerning notifications on home monitor. Has seen PVCs as well as noted increase in BP. BP increased per home records on 11/27/12 to 130s systolic over 80s to 92 diastolic. SBP previously low 100s consistently.  HR is consistently in the 60s  to low 70s. Wonders if he is having some side effects from Crestor including occasional headache, nausea, fatigue, arrhythmia, dizziness. Feels that symptoms started in conjunction with prostate biopsy 13 days ago at which time he held Eliquis 5 days, then held it another 3 days for blood in urine. He has since resumed Eliquis and has had no further bleeding concerns.  Around the same time, he was also increasing his dose of rosuvastatin based on lipid results. He is now on 40 mg daily. Continues to walk daily and since temperature has recently increased, he wonders about dehydration.  Noted that he did not have to urinate after a significant amount of fluid recently. Woke up with dizziness on 5/22 that resolved after increasing fluid intake.  Feels palpitations most often when checking his BP, rarely has symptoms at other times. Walks frequently for exercise, goes up incline with a little bit of windedness but feels that it is due to natural exertion.  He denies chest pain, dyspnea, orthopnea, PND, edema, early satiety, presyncope, syncope. Had leg cramps while off magnesium for biopsy, no problems since resuming.  Monitors occurrence of PVCs based on palpitations felt while taking BP. Occasionally feels 2 in a row.    Past Medical History:  Diagnosis Date   Agatston coronary artery calcium score greater than 400    coronary artery Ca score was 1274   Arthritis    DCM (dilated cardiomyopathy) (HCC)    EF 40-45% by echo 2015.  EF 55% by echo 2017. EF 50-55% by echo 2021   HLD (hyperlipidemia)    Hypertension    MVP (mitral valve prolapse)    s/p repair   OSA (obstructive sleep apnea)    PAF (paroxysmal atrial fibrillation) (HCC)    S/P afib ablation>>followed by Dr. Nelly Laurence   Prostate cancer [facility])    PVC's (premature ventricular contractions)    Tibialis posterior rupture, right, initial encounter    right foot    Past Surgical History:  Procedure Laterality Date   APPENDECTOMY     ATRIAL  FIBRILLATION ABLATION N/A 05/23/2022   Procedure: ATRIAL FIBRILLATION ABLATION;  Surgeon: Maurice Small, MD;  Location: [facility] INVASIVE CV LAB;  Service: Cardiovascular;  Laterality: N/A;   CARDIOVERSION N/A 03/29/2022   Procedure: CARDIOVERSION;  Surgeon: Parke Poisson, MD;  Location: [facility] ENDOSCOPY;  Service: Cardiovascular;  Laterality: N/A;   PROSTATE BIOPSY     robotic mitral valve repair     TENDON TRANSPLANT     PT tendon transplant and calcaneal osteotomy   TONSILLECTOMY      Current Medications: Current Meds  Medication Sig   acetaminophen (TYLENOL) 500 MG tablet Take 1,000 mg by mouth daily as needed for moderate pain.   apixaban (ELIQUIS) 5 MG TABS tablet Take 1 tablet (5 mg total) by mouth 2 (two) times daily.   Ascorbic Acid (VITAMIN C/ROSE HIPS TR) 1000 MG TBCR Take 1,000 mg by mouth in the morning.   carvedilol (COREG) 12.5 MG tablet Take 1 tablet (12.5 mg total) by mouth 2 (two) times daily with  a meal.   Cholecalciferol (VITAMIN D3) 125 MCG (5000 UT) TABS Take 5,000 Units by mouth in the morning.   Coenzyme Q10 300 MG CAPS Take 100 mg by mouth daily.   fexofenadine (ALLEGRA) 180 MG tablet Take 180 mg by mouth daily.   fluticasone (FLONASE) 50 MCG/ACT nasal spray Place 2 sprays into both nostrils daily.   Glucosamine-Chondroit-Calcium (TRIPLE FLEX BONE & JOINT PO) Take 1 tablet by mouth in the morning. [place] Signature Triple Action Joint Health   lisinopril (ZESTRIL) 10 MG tablet Take 1 tablet (10 mg total) by mouth daily.   magnesium oxide (MAG-OX) 400 (240 Mg) MG tablet Take 400 mg by mouth every evening.   Multiple Vitamin (MULTIVITAMIN WITH MINERALS) TABS tablet Take 1 tablet by mouth daily. [employer] Signature Adult 50+ Mature Multi Vitamins & Minerals   rosuvastatin (CRESTOR) 40 MG tablet Take 1 tablet (40 mg total) by mouth daily.   TURMERIC PO Take 1,000 mg by mouth daily.     Allergies:   Patient has no known allergies.   Social History   Socioeconomic  History   Marital status: Single    Spouse name: Not on file   Number of children: 0   Years of education: Not on file   Highest education level: Not on file  Occupational History   Occupation: Art gallery manager    Comment: retired  Tobacco Use   Smoking status: Never    Passive exposure: Past   Smokeless tobacco: Never  Vaping Use   Vaping Use: Never used  Substance and Sexual Activity   Alcohol use: No    Alcohol/week: 0.0 standard drinks of alcohol   Drug use: No   Sexual activity: Yes  Other Topics Concern   Not on file  Social History Narrative   Not on file   Social Determinants of Health   Financial Resource Strain: Not on file  Food Insecurity: No Food Insecurity (05/23/2022)   Hunger Vital Sign    Worried About Running Out of Food in the Last Year: Never true    Ran Out of Food in the Last Year: Never true  Transportation Needs: No Transportation Needs (05/23/2022)   PRAPARE - Administrator, Civil Service (Medical): No    Lack of Transportation (Non-Medical): No  Physical Activity: Not on file  Stress: Not on file  Social Connections: Not on file     Family History: The patient's family history includes Cancer in his mother; Deep vein thrombosis in his sister; Lymphoma in his maternal grandmother.  ROS:   Please see the history of present illness.    + Palpitations All other systems reviewed and are negative.  Labs/Other Studies Reviewed:    The following studies were reviewed today:  Echo 11/13/19  1. Left ventricular ejection fraction, by estimation, is 50 to 55%. The  left ventricle has low normal function. The left ventricle has no regional  wall motion abnormalities. Left ventricular diastolic function could not  be evaluated.   2. Right ventricular systolic function is normal. The right ventricular  size is normal.   3. Left atrial size was moderately dilated.   4. The mitral valve has been repaired/replaced. No evidence of mitral  valve  regurgitation. There is a present in the mitral position.   5. The aortic valve is tricuspid. Aortic valve regurgitation is not  visualized. No aortic stenosis is present.   6. Aortic dilatation noted. There is borderline dilatation of the  ascending aorta measuring 37  mm.   7. The inferior vena cava is normal in size with greater than 50%  respiratory variability, suggesting right atrial pressure of 3 mmHg.   Comparison(s): No significant change from prior study.    Recent Labs: 02/22/2022: Magnesium 2.4; TSH 2.500 05/03/2022: BUN 17; Creatinine, Ser 0.88; Potassium 4.7; Sodium 142 08/10/2022: Hemoglobin 15.7; Platelets 233 11/25/2022: ALT 21   Risk Assessment/Calculations:    CHA2DS2-VASc Score = 3  This indicates a 3.2% annual risk of stroke. The patient's score is based upon: CHF History: 0 HTN History: 1 Diabetes History: 0 Stroke History: 0 Vascular Disease History: 1 Age Score: 1 Gender Score: 0    Physical Exam:    VS:  BP 110/74   Pulse 72   Ht 5\' 10"  (1.778 m)   Wt 179 lb (81.2 kg)   SpO2 99%   BMI 25.68 kg/m     Wt Readings from Last 3 Encounters:  12/01/22 179 lb (81.2 kg)  10/20/22 177 lb (80.3 kg)  09/22/22 178 lb 6.4 oz (80.9 kg)     GEN:  Well nourished, well developed in no acute distress HEENT: Normal NECK: No JVD; No carotid bruits CARDIAC: RRR, no murmurs, rubs, gallops RESPIRATORY:  Clear to auscultation without rales, wheezing or rhonchi  ABDOMEN: Soft, non-tender, non-distended MUSCULOSKELETAL:  No edema; No deformity. 2+ pedal pulses, equal bilaterally SKIN: Warm and dry NEUROLOGIC:  Alert and oriented x 3 PSYCHIATRIC:  Normal affect   EKG:  EKG is ordered today.  EKG reveals normal sinus rhythm at 72 bpm, no ST abnormality, rhythm strips identified occasional PVCs  Diagnoses:    1. Palpitations   2. Essential hypertension   3. Hyperlipidemia, unspecified hyperlipidemia type   4. Mitral valve prolapse   5. DCM (dilated  cardiomyopathy) (HCC)   6. Persistent atrial fibrillation (HCC)   7. H/O mitral valve repair   8. Chronic anticoagulation   9. Coronary artery disease involving native coronary artery of native heart without angina pectoris      Assessment and Plan:     Palpitations: Concern about frequency of PVCs. He has been monitoring these at home based on palpitations while taking BP. Occasional bigeminy per his report.  EKG showed no PVCs, however we obtained a rhythm strip which showed occasional PVCs. Advised we can place Zio patch monitor for more consistent monitoring.  He will consider this if he has worsening episodes of palpitations. Feels that he has been dehydrated a few days recently. We discussed that frequency may have increased in response to dehydration.  We will get BMP and magnesium level today to evaluate for electrolyte disturbance and or altered kidney function.  Encouraged him to continue to hydrate well and to continue to add electrolytes, especially with increased activity/sweating.   Persistent atrial fibrillation on chronic anticoagulation: He underwent A-fib ablation on 05/23/2022 with no return of A-fib to his awareness.  He is maintaining sinus rhythm today with HR well-controlled.  He continues Eliquis 5 mg twice daily which is appropriate dose for stroke prevention for CHA2DS2-VASc score of 3. Has follow-up with Dr. Nelly Laurence, EP, in August. Continue carvedilol.  Dilated cardiomyopathy: Low normal LVEF 50 to 55% on echo 10/19/2022, no significant change from previous echo 11/2019.  He describes occasional shortness of breath with going up an incline on his walk.  This has been present for some time and he feels it is stable. He denies dyspnea, edema, orthopnea, PND. Appears euvolemic on exam. Weight is stable.  Continue carvedilol,  lisinopril.   Mitral valve prolapse: S/p MV repair 2003.  Echo 10/19/2022 reveals prosthetic annuloplasty ring in the mitral position, trivial MR, no  evidence of mitral stenosis, moderate mitral annular calcification. No significant murmur on exam. We will continue to monitor clinically at this time.   Hypertension:  BP elevated initially but improved on my recheck. Per his home records he has had slight elevation in BP over the past few days, not significant. SBP 130s, increased from 110s.  Advised him to continue to monitor and notify us with concerns.  CAD without angina: Coronary calcium score 1247 (93rd percentile) on cardiac CT 05/17/2022.  Normal stress PET cardiac scan on 08/31/2022 with no evidence of ischemia or infarction. He denies chest pain, dyspnea, or other symptoms concerning for angina.  No indication for further ischemic evaluation at this time.   Hyperlipidemia LDL goal < 70: LDL 70 on 11/25/2022.  He has increased rosuvastatin over the past several months to achieve LDL goal. Wonders if he is having some symptoms on higher dose.  I have asked him to continue to monitor and notify us if symptoms continue or worsen. In the setting of elevated CAC, if he does not tolerate rosuvastatin 40, would recommend lowering dose of rosuvastatin and considering addition of ezetimibe, bempedoic acid, or PCSK9 inhibitor.  Disposition: Keep your August appointment with Dr. Prudencio Burly months with Dr. Mayford Knife   Medication Adjustments/Labs and Tests Ordered: Current medicines are reviewed at length with the patient today.  Concerns regarding medicines are outlined above.  Orders Placed This Encounter  Procedures   Basic Metabolic Panel (BMET)   Magnesium   EKG 12-Lead   No orders of the defined types were placed in this encounter.   Patient Instructions  Medication Instructions:   Your physician recommends that you continue on your current medications as directed. Please refer to the Current Medication list given to you today.   *If you need a refill on your cardiac medications before your next appointment, please call your  pharmacy*   Lab Work:  TODAY!!! MAG/BMET  If you have labs (blood work) drawn today and your tests are completely normal, you will receive your results only by: MyChart Message (if you have MyChart) OR A paper copy in the mail If you have any lab test that is abnormal or we need to change your treatment, we will call you to review the results.   Testing/Procedures:  None ordered.   Follow-Up: At [facility], you and your health needs are our priority.  As part of our continuing mission to provide you with exceptional heart care, we have created designated Provider Care Teams.  These Care Teams include your primary Cardiologist (physician) and Advanced Practice Providers (APPs -  Physician Assistants and Nurse Practitioners) who all work together to provide you with the care you need, when you need it.  We recommend signing up for the patient portal called "MyChart".  Sign up information is provided on this After Visit Summary.  MyChart is used to connect with patients for Virtual Visits (Telemedicine).  Patients are able to view lab/test results, encounter notes, upcoming appointments, etc.  Non-urgent messages can be sent to your provider as well.   To learn more about what you can do with MyChart, go to [email or web address].    Your next appointment:   6 month(s)  Provider:   Armanda Magic, MD        Signed, Levi Aland, NP  12/01/2022 12:51 PM  [facility]

## 2022-12-01 NOTE — Patient Instructions (Signed)
Medication Instructions:   Your physician recommends that you continue on your current medications as directed. Please refer to the Current Medication list given to you today.   *If you need a refill on your cardiac medications before your next appointment, please call your pharmacy*   Lab Work:  TODAY!!! MAG/BMET  If you have labs (blood work) drawn today and your tests are completely normal, you will receive your results only by: MyChart Message (if you have MyChart) OR A paper copy in the mail If you have any lab test that is abnormal or we need to change your treatment, we will call you to review the results.   Testing/Procedures:  None ordered.   Follow-Up: At [facility], you and your health needs are our priority.  As part of our continuing mission to provide you with exceptional heart care, we have created designated Provider Care Teams.  These Care Teams include your primary Cardiologist (physician) and Advanced Practice Providers (APPs -  Physician Assistants and Nurse Practitioners) who all work together to provide you with the care you need, when you need it.  We recommend signing up for the patient portal called "MyChart".  Sign up information is provided on this After Visit Summary.  MyChart is used to connect with patients for Virtual Visits (Telemedicine).  Patients are able to view lab/test results, encounter notes, upcoming appointments, etc.  Non-urgent messages can be sent to your provider as well.   To learn more about what you can do with MyChart, go to [email or web address].    Your next appointment:   6 month(s)  Provider:   Armanda Magic, MD

## 2022-12-02 LAB — BASIC METABOLIC PANEL
BUN/Creatinine Ratio: 24 (ref 10–24)
BUN: 18 mg/dL (ref 8–27)
CO2: 24 mmol/L (ref 20–29)
Calcium: 9.8 mg/dL (ref 8.6–10.2)
Chloride: 102 mmol/L (ref 96–106)
Creatinine, Ser: 0.75 mg/dL — ABNORMAL LOW (ref 0.76–1.27)
Glucose: 90 mg/dL (ref 70–99)
Potassium: 4.7 mmol/L (ref 3.5–5.2)
Sodium: 139 mmol/L (ref 134–144)
eGFR: 100 mL/min/{1.73_m2} (ref >59)

## 2022-12-02 LAB — MAGNESIUM: Magnesium: 2.4 mg/dL — ABNORMAL HIGH (ref 1.6–2.3)

## 2022-12-08 ENCOUNTER — Telehealth: Payer: Self-pay | Admitting: Cardiology

## 2022-12-08 DIAGNOSIS — E785 Hyperlipidemia, unspecified: Secondary | ICD-10-CM

## 2022-12-08 MED ORDER — ROSUVASTATIN CALCIUM 40 MG PO TABS: 40.0000 mg | ORAL_TABLET | Freq: Every day | ORAL | 3 refills | Status: DC

## 2022-12-08 NOTE — Telephone Encounter (Signed)
*  STAT* If patient is at the pharmacy, call can be transferred to refill team.   1. Which medications need to be refilled? (please list name of each medication and dose if known) rosuvastatin (CRESTOR) 40 MG tablet   2. Which pharmacy/location (including street and city if local pharmacy) is medication to be sent to? [facility] - [place], [place] - [address]   3. Do they need a 30 day or 90 day supply? 90  

## 2022-12-08 NOTE — Telephone Encounter (Signed)
Pt's medication was sent to pt's pharmacy as requested. Confirmation received.  °

## 2022-12-14 ENCOUNTER — Encounter: Payer: Self-pay | Admitting: Pulmonary Disease

## 2022-12-20 ENCOUNTER — Telehealth: Payer: Self-pay | Admitting: Pulmonary Disease

## 2022-12-20 NOTE — Telephone Encounter (Signed)
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 He placed an order on 07/28/2022 for a CT High Res in July with a follow up OV. This patient has not been seen since January. Has he been assigned to a new MD? Does he still need this CT? If so, can another provider place an order for this to be scheduled off of and a follow up made with?

## 2022-12-23 NOTE — Telephone Encounter (Signed)
Dr. Isaiah Serge, would you be willing to see this patient?

## 2022-12-28 ENCOUNTER — Other Ambulatory Visit: Payer: Self-pay

## 2022-12-28 DIAGNOSIS — J849 Interstitial pulmonary disease, unspecified: Secondary | ICD-10-CM

## 2022-12-28 NOTE — Telephone Encounter (Signed)
Yes I can see him.  Please go ahead and schedule the high resolution CT scan that has been ordered and make a follow-up with me in clinic

## 2022-12-28 NOTE — Telephone Encounter (Signed)
ATC X1 LVM for patient to call the office back. Please advise order has been placed for CT and once approved by insurance imaging center will call and schedule. Patient also need apt with Dr. Isaiah Serge to establish care

## 2022-12-29 NOTE — Telephone Encounter (Signed)
I have scheduled CT and made appt for pt with PM.  Nothing further needed.

## 2023-01-06 ENCOUNTER — Ambulatory Visit (HOSPITAL_COMMUNITY)
Admission: RE | Admit: 2023-01-06 | Discharge: 2023-01-06 | Disposition: A | Discharge status: Home / Self Care | Payer: Medicare Other | Source: Ambulatory Visit | Attending: Pulmonary Disease | Admitting: Pulmonary Disease

## 2023-01-06 DIAGNOSIS — J849 Interstitial pulmonary disease, unspecified: Secondary | ICD-10-CM | POA: Insufficient documentation

## 2023-02-23 ENCOUNTER — Encounter: Payer: Self-pay | Admitting: Pulmonary Disease

## 2023-02-23 ENCOUNTER — Ambulatory Visit: Payer: Medicare Other | Admitting: Pulmonary Disease

## 2023-02-23 VITALS — BP 120/68 | HR 65 | Ht 70.0 in | Wt 184.0 lb

## 2023-02-23 DIAGNOSIS — J849 Interstitial pulmonary disease, unspecified: Secondary | ICD-10-CM | POA: Diagnosis not present

## 2023-02-23 NOTE — Progress Notes (Signed)
Jordan Payne    [id number]    [birth year]  Primary Care Physician:Ross, Darlen Round, MD  Referring Physician: Daisy Floro, MD [address] [place],  [place] [address]  Chief complaint: Follow-up for interstitial lung disease  HPI: 67 y.o. who  has a past medical history of Agatston coronary artery calcium score greater than 400, Arthritis, DCM (dilated cardiomyopathy) (HCC), HLD (hyperlipidemia), Hypertension, MVP (mitral valve prolapse), OSA (obstructive sleep apnea), PAF (paroxysmal atrial fibrillation) (HCC), Prostate cancer (HCC), PVC's (premature ventricular contractions), and Tibialis posterior rupture, right, initial encounter.   She was previously evaluated by Dr. Kendrick Fries in January 2024 for abnormal CT scan, question of interstitial lung disease.  He had a CT venogram ordered in preparation for atrial fibrillation which showed right middle lobe lobe opacities.  History notable for bacterial pneumonia in 2019 and 2022 with what sounds like of parapneumonic effusion.  He also had COVID in 2023 for which he took molnupiravir  Pets: Used to have a dog Occupation: Retired Acupuncturist Exposures: No mold, hot tub, Financial controller.  No feather pillows or comforter Smoking history: Never smoker Travel history: Previously lived in [place] in [place].  No significant recent travel Relevant family history: No family history of lung disease  Outpatient Encounter Medications as of 02/23/2023  Medication Sig   acetaminophen (TYLENOL) 500 MG tablet Take 1,000 mg by mouth daily as needed for moderate pain.   apixaban (ELIQUIS) 5 MG TABS tablet Take 1 tablet (5 mg total) by mouth 2 (two) times daily.   Ascorbic Acid (VITAMIN C/ROSE HIPS TR) 1000 MG TBCR Take 1,000 mg by mouth in the morning.   carvedilol (COREG) 12.5 MG tablet Take 1 tablet (12.5 mg total) by mouth 2 (two) times daily with a meal.   Cholecalciferol (VITAMIN D3) 125 MCG (5000 UT) TABS Take 5,000 Units by  mouth in the morning.   Coenzyme Q10 (CO Q 10 PO) Take 100 mg by mouth daily.   fexofenadine (ALLEGRA) 180 MG tablet Take 180 mg by mouth daily.   fluticasone (FLONASE) 50 MCG/ACT nasal spray Place 2 sprays into both nostrils daily.   Glucosamine-Chondroit-Calcium (TRIPLE FLEX BONE & JOINT PO) Take 1 tablet by mouth in the morning. [facility] Signature Triple Action Joint Health   lisinopril (ZESTRIL) 10 MG tablet Take 1 tablet (10 mg total) by mouth daily.   Magnesium 100 MG CAPS Take 1 capsule by mouth daily.   Multiple Vitamin (MULTIVITAMIN WITH MINERALS) TABS tablet Take 1 tablet by mouth daily. [facility] Signature Adult 50+ Mature Multi Vitamins & Minerals   rosuvastatin (CRESTOR) 40 MG tablet Take 1 tablet (40 mg total) by mouth daily.   TURMERIC PO Take 1,000 mg by mouth daily.   [DISCONTINUED] Coenzyme Q10 300 MG CAPS Take 100 mg by mouth daily.   [DISCONTINUED] magnesium oxide (MAG-OX) 400 (240 Mg) MG tablet Take 400 mg by mouth every evening.   No facility-administered encounter medications on file as of 02/23/2023.    Allergies as of 02/23/2023   (No Known Allergies)    Past Medical History:  Diagnosis Date   Agatston coronary artery calcium score greater than 400    coronary artery Ca score was 1274   Arthritis    DCM (dilated cardiomyopathy) (HCC)    EF 40-45% by echo 2015.  EF 55% by echo 2017. EF 50-55% by echo 2021   HLD (hyperlipidemia)    Hypertension    MVP (mitral valve prolapse)  s/p repair   OSA (obstructive sleep apnea)    PAF (paroxysmal atrial fibrillation) (HCC)    S/P afib ablation>>followed by Dr. Nelly Laurence   Prostate cancer [facility])    PVC's (premature ventricular contractions)    Tibialis posterior rupture, right, initial encounter    right foot    Past Surgical History:  Procedure Laterality Date   APPENDECTOMY     ATRIAL FIBRILLATION ABLATION N/A 05/23/2022   Procedure: ATRIAL FIBRILLATION ABLATION;  Surgeon: Maurice Small, MD;  [facility]: [facility]  INVASIVE CV LAB;  Service: Cardiovascular;  Laterality: N/A;   CARDIOVERSION N/A 03/29/2022   Procedure: CARDIOVERSION;  Surgeon: Parke Poisson, MD;  Location: [facility] ENDOSCOPY;  Service: Cardiovascular;  Laterality: N/A;   PROSTATE BIOPSY     robotic mitral valve repair     TENDON TRANSPLANT     PT tendon transplant and calcaneal osteotomy   TONSILLECTOMY      Family History  Problem Relation Age of Onset   Cancer Mother        unsure pt states,"i believe she had a hysterectomy to remove cancer"   Deep vein thrombosis Sister    Lymphoma Maternal Grandmother     Social History   Socioeconomic History   Marital status: Single    Spouse name: Not on file   Number of children: 0   Years of education: Not on file   Highest education level: Not on file  Occupational History   Occupation: Art gallery manager    Comment: retired  Tobacco Use   Smoking status: Never    Passive exposure: Past   Smokeless tobacco: Never  Vaping Use   Vaping status: Never Used  Substance and Sexual Activity   Alcohol use: No    Alcohol/week: 0.0 standard drinks of alcohol   Drug use: No   Sexual activity: Yes  Other Topics Concern   Not on file  Social History Narrative   Not on file   Social Determinants of Health   Financial Resource Strain: Not on file  Food Insecurity: No Food Insecurity (05/23/2022)   Hunger Vital Sign    Worried About Running Out of Food in the Last Year: Never true    Ran Out of Food in the Last Year: Never true  Transportation Needs: No Transportation Needs (05/23/2022)   PRAPARE - Administrator, Civil Service (Medical): No    Lack of Transportation (Non-Medical): No  Physical Activity: Not on file  Stress: Not on file  Social Connections: Not on file  Intimate Partner Violence: Not At Risk (05/23/2022)   Humiliation, Afraid, Rape, and Kick questionnaire    Fear of Current or Ex-Partner: No    Emotionally Abused: No    Physically Abused: No    Sexually  Abused: No    Review of systems: Review of Systems  Constitutional: Negative for fever and chills.  HENT: Negative.   Eyes: Negative for blurred vision.  Respiratory: as per HPI  Cardiovascular: Negative for chest pain and palpitations.  Gastrointestinal: Negative for vomiting, diarrhea, blood per rectum. Genitourinary: Negative for dysuria, urgency, frequency and hematuria.  Musculoskeletal: Negative for myalgias, back pain and joint pain.  Skin: Negative for itching and rash.  Neurological: Negative for dizziness, tremors, focal weakness, seizures and loss of consciousness.  Endo/Heme/Allergies: Negative for environmental allergies.  Psychiatric/Behavioral: Negative for depression, suicidal ideas and hallucinations.  All other systems reviewed and are negative.  Physical Exam: Blood pressure 120/68, pulse 65, height 5\' 10"  (1.778 m), weight 184  lb (83.5 kg), SpO2 97%. Gen:      No acute distress HEENT:  EOMI, sclera anicteric Neck:     No masses; no thyromegaly Lungs:    Clear to auscultation bilaterally; normal respiratory effort CV:         Regular rate and rhythm; no murmurs Abd:      + bowel sounds; soft, non-tender; no palpable masses, no distension Ext:    No edema; adequate peripheral perfusion Skin:      Warm and dry; no rash Neuro: alert and oriented x 3 Psych: normal mood and affect  Data Reviewed: Imaging: CT high-resolution 07/26/2022-scarring in the anterior right upper lobe and right middle lobe with bland linear scarring/atelectasis at the lung base.  Fine centrilobular nodularity.  CT high-resolution 01/06/2019 4-6 stable scarring in pleuroparenchymal area of right upper and right middle lobe.  Stable linear scarring in the lower lobes. I have reviewed the images personally.  PFTs:  Labs:  Assessment:  Assessment for interstitial lung disease  Review of his CT scan shows some stable scarring in the right upper and right middle lobe which could be seen  today of prior infection.  Also noted that he had mitral valve surgery in the past and it is speculated that he could have some lung injury during the surgery.  Nonetheless it has been stable and there is no clear evidence of any diffuse interstitial process  He is stable with his breathing and will continue to monitor  Plan/Recommendations: High-res CT in 1 year.  Chilton Greathouse MD [facility] 02/23/2023, 10:27 AM  CC: Daisy Floro, MD

## 2023-02-23 NOTE — Patient Instructions (Signed)
I am glad you are doing well with your breathing I have reviewed his CT scan which showed some stable scarring Will order a follow-up high-res CT in 1 year Return to clinic in 1 year after CT scan

## 2023-02-24 ENCOUNTER — Ambulatory Visit: Payer: Medicare Other | Attending: Cardiovascular Disease | Admitting: Cardiovascular Disease

## 2023-02-24 ENCOUNTER — Encounter: Payer: Self-pay | Admitting: Cardiovascular Disease

## 2023-02-24 VITALS — BP 124/78 | HR 68 | Ht 70.0 in | Wt 185.6 lb

## 2023-02-24 DIAGNOSIS — I420 Dilated cardiomyopathy: Secondary | ICD-10-CM | POA: Insufficient documentation

## 2023-02-24 DIAGNOSIS — I493 Ventricular premature depolarization: Secondary | ICD-10-CM | POA: Diagnosis present

## 2023-02-24 DIAGNOSIS — I4819 Other persistent atrial fibrillation: Secondary | ICD-10-CM | POA: Diagnosis present

## 2023-02-24 NOTE — Progress Notes (Signed)
Cardiology Office Note:    Date:  02/24/2023   ID:  Jordan Payne, DOB 1956-01-21, MRN [id number]  PCP:  Jordan Floro, MD   [facility] Providers Cardiologist:  Armanda Magic, MD Electrophysiologist:  Maurice Small, MD     Referring MD: Jordan Floro, MD   Chief complaint: palpitations, fatigue, increased heart rate  History of Present Illness:    Jordan Payne is a 67 y.o. male with a hx of MVP s/p MV repair, HTN, dilated cardiomyopathy with recovered EF, frequent PVCs, and atrial fibrillation.    He underwent mitral valve repair in 2003. EF was moderately reduced, 40-45% in 2015 but had recovered to 50-55% by 2017.  He was diagnosed with AF in August 2023. ECGs from 8/15 and 9/11 2023 show AF. During the 9/11 visit, he reported increased fatigue, lightheadedness and palpitations described as "feeling of chaos" in his chest. He underwent DC cardioversion later in the month.  He remained in sinus rhythm for no more than 4 days.  He has a phone app that monitors snoring and noted that the night before the AF recurrent showed high uptick in snoring intensity.  He underwent ablation for persistent atrial fibrillation in November, 2023. He had frequent recurrence of AF during the procedure and required cardioversion 3 times. Since the ablation he has been doing very well. No symptoms of recurrence since recovering from the acute phase of the procedure. Functional capacity has improved.    Today, he reports that he is continued to maintain sinus rhythm and has not had any symptoms of A-fib recurrence.  He has had occasional palpitations that have correlated with PVCs.  Past Medical History:  Diagnosis Date   Agatston coronary artery calcium score greater than 400    coronary artery Ca score was 1274   Arthritis    DCM (dilated cardiomyopathy) (HCC)    EF 40-45% by echo 2015.  EF 55% by echo 2017. EF 50-55% by echo 2021   HLD (hyperlipidemia)    Hypertension     MVP (mitral valve prolapse)    s/p repair   OSA (obstructive sleep apnea)    PAF (paroxysmal atrial fibrillation) (HCC)    S/P afib ablation>>followed by Dr. Nelly Payne   Prostate cancer [facility])    PVC's (premature ventricular contractions)    Tibialis posterior rupture, right, initial encounter    right foot       EKGs/Labs/Other Studies Reviewed:     TTE 11/2019 EF 50-55%, left atrium is moderately dilated  TTE 10/19/2022 EF 50 to 55%.  Left atrial size moderately dilated.  EKG:  Last EKG results: Today - AF with controlled rate   Recent Labs: 08/10/2022: Hemoglobin 15.7; Platelets 233 11/25/2022: ALT 21 12/01/2022: BUN 18; Creatinine, Ser 0.75; Magnesium 2.4; Potassium 4.7; Sodium 139    Risk Assessment/Calculations:    CHA2DS2-VASc Score = 3   This indicates a 3.2% annual risk of stroke. The patient's score is based upon: CHF History: 0 HTN History: 1 Diabetes History: 0 Stroke History: 0 Vascular Disease History: 1 Age Score: 1 Gender Score: 0    STOP-Bang Score:  6       Physical Exam:    VS:  BP 124/78 (BP Location: Left Arm, Patient Position: Sitting, Cuff Size: Normal)   Pulse 68   Ht 5\' 10"  (1.778 m)   Wt 185 lb 9.6 oz (84.2 kg)   SpO2 97%   BMI 26.63 kg/m     Wt Readings from Last  3 Encounters:  02/24/23 185 lb 9.6 oz (84.2 kg)  02/23/23 184 lb (83.5 kg)  12/01/22 179 lb (81.2 kg)     GEN:  Well nourished, well developed in no acute distress CARDIAC: Irregular, no murmurs, rubs, gallops RESPIRATORY:  Normal work of breathing MUSCULOSKELETAL: no edema    ASSESSMENT & PLAN:    Persistent atrial fibrillation:  Maintaining sinus rhythm s/p AF ablation.  Exertional tolerance has improved.  Longterm use of anticoagulation:  CHADS2VASC is 2. Continue eliquis 5  Cardiomyopathy: recovered  Frequent PVCs Appears to be triggered/exacerbated by hot weather and dehydration Frequency < 3/minute by his monitoring Will continue to monitor  MVP  s/p repair  OSA: about to start CPAP     Medication Adjustments/Labs and Tests Ordered: Current medicines are reviewed at length with the patient today.  Concerns regarding medicines are outlined above.  Orders Placed This Encounter  Procedures   EKG 12-Lead   No orders of the defined types were placed in this encounter.    Signed, Maurice Small, MD  02/24/2023 2:06 PM    [facility]

## 2023-02-24 NOTE — Patient Instructions (Signed)

## 2023-05-29 ENCOUNTER — Encounter: Payer: Self-pay | Admitting: Cardiology

## 2023-05-29 ENCOUNTER — Ambulatory Visit: Payer: Medicare Other | Attending: Cardiology | Admitting: Cardiology

## 2023-05-29 VITALS — BP 118/86 | HR 69 | Ht 70.0 in | Wt 190.6 lb

## 2023-05-29 DIAGNOSIS — I2510 Atherosclerotic heart disease of native coronary artery without angina pectoris: Secondary | ICD-10-CM

## 2023-05-29 DIAGNOSIS — I10 Essential (primary) hypertension: Secondary | ICD-10-CM | POA: Diagnosis not present

## 2023-05-29 DIAGNOSIS — Z79899 Other long term (current) drug therapy: Secondary | ICD-10-CM

## 2023-05-29 DIAGNOSIS — I420 Dilated cardiomyopathy: Secondary | ICD-10-CM | POA: Diagnosis present

## 2023-05-29 DIAGNOSIS — I4819 Other persistent atrial fibrillation: Secondary | ICD-10-CM

## 2023-05-29 DIAGNOSIS — G4733 Obstructive sleep apnea (adult) (pediatric): Secondary | ICD-10-CM

## 2023-05-29 DIAGNOSIS — E7800 Pure hypercholesterolemia, unspecified: Secondary | ICD-10-CM

## 2023-05-29 DIAGNOSIS — I341 Nonrheumatic mitral (valve) prolapse: Secondary | ICD-10-CM

## 2023-05-29 MED ORDER — ROSUVASTATIN CALCIUM 20 MG PO TABS: 20.0000 mg | ORAL_TABLET | Freq: Every day | ORAL | 3 refills | Status: DC

## 2023-05-29 MED ORDER — APIXABAN 5 MG PO TABS: 5.0000 mg | ORAL_TABLET | Freq: Two times a day (BID) | ORAL | 3 refills | Status: DC

## 2023-05-29 NOTE — Patient Instructions (Signed)
Medication Instructions:  Please DECREASE your dose of crestor to 20 mg daily.  *If you need a refill on your cardiac medications before your next appointment, please call your pharmacy*   Lab Work: Please complete a FASTING lipid panel and an ALT in our lab in 6 weeks.  If you have labs (blood work) drawn today and your tests are completely normal, you will receive your results only by: MyChart Message (if you have MyChart) OR A paper copy in the mail If you have any lab test that is abnormal or we need to change your treatment, we will call you to review the results.   Testing/Procedures: None.   Follow-Up: At [facility], you and your health needs are our priority.  As part of our continuing mission to provide you with exceptional heart care, we have created designated Provider Care Teams.  These Care Teams include your primary Cardiologist (physician) and Advanced Practice Providers (APPs -  Physician Assistants and Nurse Practitioners) who all work together to provide you with the care you need, when you need it.  We recommend signing up for the patient portal called "MyChart".  Sign up information is provided on this After Visit Summary.  MyChart is used to connect with patients for Virtual Visits (Telemedicine).  Patients are able to view lab/test results, encounter notes, upcoming appointments, etc.  Non-urgent messages can be sent to your provider as well.   To learn more about what you can do with MyChart, go to [email or web address].    Your next appointment:   1 year(s)  Provider:   Armanda Magic, MD     Other Instructions Dr. Mayford Knife has referred you to our lipid clinic where you will work with a pharmacist to optimize your cholesterol-lowering medications.

## 2023-05-29 NOTE — Progress Notes (Signed)
Date:  05/29/2023   ID:  Sindy Messing, DOB 11-Oct-1955, MRN [id number] The patient was identified using 2 identifiers.  PCP:  Daisy Floro, MD   [facility] Providers Cardiologist:  Armanda Magic, MD Electrophysiologist:  Maurice Small, MD     Evaluation Performed:  Follow-Up Visit  Chief Complaint:  CAD, HTN, HLD, PAF  History of Present Illness:    Ossie Sebo is a 67 y.o. male  male with a hx of MVP s/p MV repair, PAF followed by Dr. Nelly Laurence and afib clinic, HTN and DCM (EF normalized to 55% on echo 09/23/2015). He was sent for a sleep study due to PAF which showed severe OSA with an AHI of 32/hr and no significant central events. He underwent CPAP titration to 16cm H2O.    He is here today for followup and is doing well.  He denies any chest pain or pressure, SOB, DOE, PND, orthopnea, LE edema, dizziness, palpitations or syncope. He has been having a lot of problems with joint aches and is worried that it may be due to the statin.   He is doing well with his PAP device and thinks that he has gotten used to it.  He tolerates the mask and feels the pressure is adequate.  Since going on PAP he feels rested in the am and has no significant daytime sleepiness if he slept well the night before and did not nap any the day before. Marland Kitchen  He denies any significant mouth or nasal dryness or nasal congestion.  He does not think that he snores.    Past Medical History:  Diagnosis Date   Agatston coronary artery calcium score greater than 400    coronary artery Ca score was 1274   Arthritis    DCM (dilated cardiomyopathy) (HCC)    EF 40-45% by echo 2015.  EF 55% by echo 2017. EF 50-55% by echo 2021   HLD (hyperlipidemia)    Hypertension    MVP (mitral valve prolapse)    s/p repair   OSA (obstructive sleep apnea)    PAF (paroxysmal atrial fibrillation) (HCC)    S/P afib ablation>>followed by Dr. Nelly Laurence   Prostate cancer [facility])    PVC's (premature ventricular  contractions)    Tibialis posterior rupture, right, initial encounter    right foot   Past Surgical History:  Procedure Laterality Date   APPENDECTOMY     ATRIAL FIBRILLATION ABLATION N/A 05/23/2022   Procedure: ATRIAL FIBRILLATION ABLATION;  Surgeon: Maurice Small, MD;  Location: [facility] INVASIVE CV LAB;  Service: Cardiovascular;  Laterality: N/A;   CARDIOVERSION N/A 03/29/2022   Procedure: CARDIOVERSION;  Surgeon: Parke Poisson, MD;  Location: [facility] ENDOSCOPY;  Service: Cardiovascular;  Laterality: N/A;   PROSTATE BIOPSY     robotic mitral valve repair     TENDON TRANSPLANT     PT tendon transplant and calcaneal osteotomy   TONSILLECTOMY       Current Meds  Medication Sig   acetaminophen (TYLENOL) 500 MG tablet Take 1,000 mg by mouth daily as needed for moderate pain.   apixaban (ELIQUIS) 5 MG TABS tablet Take 1 tablet (5 mg total) by mouth 2 (two) times daily.   Ascorbic Acid (VITAMIN C/ROSE HIPS TR) 1000 MG TBCR Take 1,000 mg by mouth in the morning.   carvedilol (COREG) 12.5 MG tablet Take 1 tablet (12.5 mg total) by mouth 2 (two) times daily with a meal.   Cholecalciferol (VITAMIN D3) 125 MCG (5000 UT)  TABS Take 5,000 Units by mouth in the morning.   Coenzyme Q10 (CO Q 10 PO) Take 100 mg by mouth daily.   fexofenadine (ALLEGRA) 180 MG tablet Take 180 mg by mouth daily.   fluticasone (FLONASE) 50 MCG/ACT nasal spray Place 2 sprays into both nostrils daily.   Glucosamine-Chondroit-Calcium (TRIPLE FLEX BONE & JOINT PO) Take 1 tablet by mouth in the morning. [facility] Signature Triple Action Joint Health   lisinopril (ZESTRIL) 10 MG tablet Take 1 tablet (10 mg total) by mouth daily.   Magnesium 100 MG CAPS Take 1 capsule by mouth daily.   Multiple Vitamin (MULTIVITAMIN WITH MINERALS) TABS tablet Take 1 tablet by mouth daily. [facility] Signature Adult 50+ Mature Multi Vitamins & Minerals   rosuvastatin (CRESTOR) 40 MG tablet Take 1 tablet (40 mg total) by mouth daily.   TURMERIC PO  Take 1,000 mg by mouth daily.     Allergies:   Patient has no known allergies.   Social History   Tobacco Use   Smoking status: Never    Passive exposure: Past   Smokeless tobacco: Never  Vaping Use   Vaping status: Never Used  Substance Use Topics   Alcohol use: No    Alcohol/week: 0.0 standard drinks of alcohol   Drug use: No     Family Hx: The patient's family history includes Cancer in his mother; Deep vein thrombosis in his sister; Lymphoma in his maternal grandmother.  ROS:   Please see the history of present illness.     All other systems reviewed and are negative.   Prior CV studies:   The following studies were reviewed today:  PAP compliance download  Labs/Other Tests and Data Reviewed:     Recent Labs: 08/10/2022: Hemoglobin 15.7; Platelets 233 11/25/2022: ALT 21 12/01/2022: BUN 18; Creatinine, Ser 0.75; Magnesium 2.4; Potassium 4.7; Sodium 139   Recent Lipid Panel Lab Results  Component Value Date/Time   CHOL 127 11/25/2022 07:38 AM   TRIG 94 11/25/2022 07:38 AM   HDL 39 (L) 11/25/2022 07:38 AM   CHOLHDL 3.3 11/25/2022 07:38 AM   LDLCALC 70 11/25/2022 07:38 AM    Wt Readings from Last 3 Encounters:  05/29/23 190 lb 9.6 oz (86.5 kg)  02/24/23 185 lb 9.6 oz (84.2 kg)  02/23/23 184 lb (83.5 kg)     Risk Assessment/Calculations:    CHA2DS2-VASc Score = 3   This indicates a 3.2% annual risk of stroke. The patient's score is based upon: CHF History: 0 HTN History: 1 Diabetes History: 0 Stroke History: 0 Vascular Disease History: 1 Age Score: 1 Gender Score: 0     STOP-Bang Score:         Objective:    Vital Signs:  BP 118/86   Pulse 69   Ht 5\' 10"  (1.778 m)   Wt 190 lb 9.6 oz (86.5 kg)   SpO2 96%   BMI 27.35 kg/m   GEN: Well nourished, well developed in no acute distress HEENT: Normal NECK: No JVD; No carotid bruits LYMPHATICS: No lymphadenopathy CARDIAC:RRR, no murmurs, rubs, gallops RESPIRATORY:  Clear to auscultation without  rales, wheezing or rhonchi  ABDOMEN: Soft, non-tender, non-distended MUSCULOSKELETAL:  No edema; No deformity  SKIN: Warm and dry NEUROLOGIC:  Alert and oriented x 3 PSYCHIATRIC:  Normal affect  ASSESSMENT & PLAN:    OSA - The patient is tolerating PAP therapy well without any problems. The PAP download performed by his DME was personally reviewed and interpreted by me today and showed  an AHI of 0.7 /hr on auto CPAP from 4-12 cm H2O with 100 % compliance in using more than 4 hours nightly.  The patient has been using and benefiting from PAP use and will continue to benefit from therapy.   HTN -BP is adequately controlled on exam today -Continue prescription drug management with carvedilol 12.5 mg twice daily and lisinopril 10 mg daily with as needed refills  Mitral valve prolapse -Status post mitral valve repair 2003 -Echo 10/19/2022 showed stable annuloplasty ring with trivial MR and no evidence of mitral stenosis  ASCAD -Coronary calcium score 1247 (93rd percentile cardiac CT 05/2022) -Normal stress PET CT 08/31/2022 with no ischemia or infarction -He has not had any anginal symptoms -No aspirin due to DOAC -Continue prescription drug management with carvedilol 12.5 mg twice daily and Crestor 40 mg daily with as needed refills  Hyperlipidemia -LDL goal less than 70 -I have personally reviewed and interpreted outside labs performed by patient's PCP which showed LDL 70 and HDL 39 on 11/25/2022 ALT 21 -he has been having problems with the higher dose of statin causing joint aches -I will decrease Crestor to 20mg  daily and refer to lipid clinic in regards to possible adding [id number]  Dilated cardiomyopathy -EF low normal at 50 to 55% on echo 04/2023 with no change from previous echo 2021 -Denies any shortness of breath or lower extremity edema -Appears euvolemic on exam today -Continue prescription drug management with carvedilol 12.5 mg twice daily and lisinopril 10 mg daily with as  needed refills  Persistent atrial fibrillation -Status post A-fib ablation 05/30/2022 -He is maintaining normal sinus rhythm on exam -he saw Dr. Nelly Laurence in August 2024 with concerns of palpitations triggered by hot weather and dehydration and his heart monitor showed PVCs but <3/hr and Dr. Nelly Laurence recommended to continue to monitor>>he does drink 5 ETOH drinks weekly and we discussed that this could trigger PVCs -No bleeding problems on DOAC -Continue prescription drug management with Eliquis 5 mg twice daily and carvedilol 12.5 mg twice daily -He continues to follow-up with EP   Time:   Today, I have spent 20 minutes with the patient with telehealth technology discussing the above problems.     Medication Adjustments/Labs and Tests Ordered: Current medicines are reviewed at length with the patient today.  Concerns regarding medicines are outlined above.   Tests Ordered: No orders of the defined types were placed in this encounter.   Medication Changes: No orders of the defined types were placed in this encounter.   Follow Up:  In Person in 1 year(s)  Signed, Armanda Magic, MD  05/29/2023 1:59 PM    [facility]

## 2023-05-29 NOTE — Addendum Note (Signed)
Addended by: Luellen Pucker on: 05/29/2023 02:30 PM   Modules accepted: Orders

## 2023-05-30 ENCOUNTER — Telehealth: Payer: Self-pay | Admitting: Cardiology

## 2023-05-30 DIAGNOSIS — E7800 Pure hypercholesterolemia, unspecified: Secondary | ICD-10-CM

## 2023-05-30 NOTE — Telephone Encounter (Signed)
*  STAT* If patient is at the pharmacy, call can be transferred to refill team.   1. Which medications need to be refilled? (please list name of each medication and dose if known) rosuvastatin (CRESTOR) 20 MG tablet   2. Which pharmacy/location (including street and city if local pharmacy) is medication to be sent to?  [id number] - [place], [place] - [address]    3. Do they need a 30 day or 90 day supply? 30

## 2023-05-31 NOTE — Telephone Encounter (Signed)
Refused Refill request already sent

## 2023-05-31 NOTE — Telephone Encounter (Signed)
Please see below. Patient is out of medication

## 2023-06-01 ENCOUNTER — Other Ambulatory Visit: Payer: Self-pay | Admitting: Cardiology

## 2023-06-01 DIAGNOSIS — E7800 Pure hypercholesterolemia, unspecified: Secondary | ICD-10-CM

## 2023-06-01 MED ORDER — ROSUVASTATIN CALCIUM 20 MG PO TABS: 20.0000 mg | ORAL_TABLET | Freq: Every day | ORAL | 3 refills | Status: DC

## 2023-07-04 ENCOUNTER — Ambulatory Visit
Payer: Medicare Other | Attending: Cardiovascular Disease | Admitting: Pharmacist Clinician (PhC)/ Clinical Pharmacy Specialist

## 2023-07-04 DIAGNOSIS — E785 Hyperlipidemia, unspecified: Secondary | ICD-10-CM | POA: Insufficient documentation

## 2023-07-04 NOTE — Progress Notes (Unsigned)
Office Visit    Patient Name: Jordan Payne Date of Encounter: 07/04/2023  Primary Care Provider:  Daisy Floro, MD Primary Cardiologist:  Armanda Magic, MD  Chief Complaint    Hyperlipidemia   Significant Past Medical History   AF CHADS2-VASC =3 on Eliquis 5 bid  CAD CAC = 1247 (93rd percentile)  OSA On CPAP, 100% compliance           No Known Allergies  History of Present Illness    Jordan Payne is a 67 y.o. male patient of Dr Mayford Knife, in the office today to discuss options for cholesterol management.  He was seen by Dr. Mayford Knife in November, at which time he complained of joint aches, suspected to be from his statin.  She decreased the rosuvastatin from 40 to 20 mg daily.    Was on 20 mg, but bumped to 40 to further lower LDL, got to 70 but developed myalgias and headaches.    Insurance Carrier:  [employer] Medicare/ [employer]  LDL Cholesterol goal:  LDL< 70  Current Medications:   rosuvastatin 20 mg every day   Family Hx:  parents were older; mother died from brain stem stroke, flather ided heart failure, paternal uncle MI, sister deceased; no kids  Social Hx: Tobacco: no Alcohol: occasional, has cut back over time, never more than 2 per day, couple of days per week, mostly beer  Diet:  avoids sugar, desserts; occasional red meat more chicken and fish, lots of fruit (too much, he wonders), vegetables regularly; does eat out some  Exercise: walking mostly since pandemic, averages about 3 miles per day, recently joined gym  Adherence Assessment  Do you ever forget to take your medication? [] Yes [] No  Do you ever skip doses due to side effects? [] Yes [] No  Do you have trouble affording your medicines? [] Yes [] No  Are you ever unable to pick up your medication due to transportation difficulties? [] Yes [] No  Do you ever stop taking your medications because you don't believe they are helping? [] Yes [] No  Do you check your weight daily? [] Yes [] No    Adherence strategy: ***  Barriers to obtaining medications: ***     Accessory Clinical Findings   Lab Results  Component Value Date   CHOL 127 11/25/2022   HDL 39 (L) 11/25/2022   LDLCALC 70 11/25/2022   TRIG 94 11/25/2022   CHOLHDL 3.3 11/25/2022    No results found for: "LIPOA"  Lab Results  Component Value Date   ALT 21 11/25/2022   AST 18 02/22/2022   ALKPHOS 64 02/22/2022   BILITOT 0.7 02/22/2022   Lab Results  Component Value Date   CREATININE 0.75 (L) 12/01/2022   BUN 18 12/01/2022   NA 139 12/01/2022   K 4.7 12/01/2022   CL 102 12/01/2022   CO2 24 12/01/2022   No results found for: "HGBA1C"  Home Medications    Current Outpatient Medications  Medication Sig Dispense Refill   acetaminophen (TYLENOL) 500 MG tablet Take 1,000 mg by mouth daily as needed for moderate pain.     apixaban (ELIQUIS) 5 MG TABS tablet Take 1 tablet (5 mg total) by mouth 2 (two) times daily. 180 tablet 3   Ascorbic Acid (VITAMIN C/ROSE HIPS TR) 1000 MG TBCR Take 1,000 mg by mouth in the morning.     carvedilol (COREG) 12.5 MG tablet Take 1 tablet (12.5 mg total) by mouth 2 (two) times daily with a meal. 180 tablet 3   Cholecalciferol (VITAMIN  D3) 125 MCG (5000 UT) TABS Take 5,000 Units by mouth in the morning.     Coenzyme Q10 (CO Q 10 PO) Take 100 mg by mouth daily.     fexofenadine (ALLEGRA) 180 MG tablet Take 180 mg by mouth daily.     Glucosamine-Chondroit-Calcium (TRIPLE FLEX BONE & JOINT PO) Take 1 tablet by mouth in the morning. [place] Signature Triple Action Joint Health     lisinopril (ZESTRIL) 10 MG tablet Take 1 tablet (10 mg total) by mouth daily. 90 tablet 3   Magnesium 100 MG CAPS Take 1 capsule by mouth daily.     Multiple Vitamin (MULTIVITAMIN WITH MINERALS) TABS tablet Take 1 tablet by mouth daily. [place] Signature Adult 50+ Mature Multi Vitamins & Minerals     rosuvastatin (CRESTOR) 20 MG tablet Take 1 tablet (20 mg total) by mouth daily. 90 tablet 3    TURMERIC PO Take 1,000 mg by mouth daily.     No current facility-administered medications for this visit.     Assessment & Plan    No problem-specific Assessment & Plan notes found for this encounter.   Phillips Hay, PharmD CPP [facility] [address]  [place], [place] [address] [phone]  07/04/2023, 8:00 AM

## 2023-07-04 NOTE — Patient Instructions (Signed)
Your Results:             Your most recent labs Goal  Total Cholesterol 127 < 200  Triglycerides 94 < 150  HDL (happy/good cholesterol) 39 > 40  LDL (lousy/bad cholesterol 70 < 70   Medication changes:  No changes to medication at this point, continue with the rosuvastatin 20 mg daily.  Will consider Praluent (Repatha) once we get the lab results back.  Lab orders:  We want to repeat labs in mid January.     Patient Assistance:    If income is less than 5x poverty level, currently around $72,500 per year   Thank you for choosing [facility]

## 2023-07-10 ENCOUNTER — Encounter: Payer: Self-pay | Admitting: Pharmacist Clinician (PhC)/ Clinical Pharmacy Specialist

## 2023-07-10 NOTE — Assessment & Plan Note (Signed)
Assessment: Patient with ASCVD not at LDL goal of < 70 Most recent LDL 70 on 11/25/22 LDL was taken when on rosuvastatin 40 mg.  Will need to repeat as labs are now 71 months old and he is no longer on that same dose.   Has been compliant with high intensity statin:  rosuvastatin 20 mg Reviewed options for lowering LDL cholesterol, including ezetimibe, PCSK-9 inhibitors, bempedoic acid and inclisiran.  Discussed mechanisms of action, dosing, side effects, potential decreases in LDL cholesterol and costs.  Also reviewed potential options for patient assistance.  Plan: Will continue rosuvastatin 20 mg daily and repeat labs after the first of the new year.   Repeat labs in January  Lipid Liver function Will consider PCSK9 inhibitor once results are in

## 2023-07-14 ENCOUNTER — Other Ambulatory Visit: Payer: Self-pay | Admitting: Cardiology

## 2023-07-25 LAB — LIPID PANEL
Chol/HDL Ratio: 3.3 {ratio} (ref 0.0–5.0)
Cholesterol, Total: 127 mg/dL (ref 100–199)
HDL: 39 mg/dL — ABNORMAL LOW (ref >39)
LDL Chol Calc (NIH): 71 mg/dL (ref 0–99)
Triglycerides: 85 mg/dL (ref 0–149)
VLDL Cholesterol Cal: 17 mg/dL (ref 5–40)

## 2023-07-25 LAB — ALT: ALT: 18 [IU]/L (ref 0–44)

## 2023-07-26 ENCOUNTER — Telehealth: Payer: Self-pay

## 2023-07-26 NOTE — Telephone Encounter (Signed)
-----   Message from Gaylyn Keas sent at 07/26/2023  3:48 PM EST ----- Lipids at goal continue current therapy and forward to PCP

## 2023-07-26 NOTE — Telephone Encounter (Signed)
 Call to patient to advise that lipids are at goal and to continue current therapy. Patient verbalizes understanding and agrees to plan. Results forwarded to PCP.

## 2023-08-01 ENCOUNTER — Telehealth: Payer: Self-pay | Admitting: Cardiology

## 2023-08-01 DIAGNOSIS — E7800 Pure hypercholesterolemia, unspecified: Secondary | ICD-10-CM

## 2023-08-01 MED ORDER — ROSUVASTATIN CALCIUM 20 MG PO TABS: 20.0000 mg | ORAL_TABLET | Freq: Every day | ORAL | 3 refills | Status: DC

## 2023-08-01 NOTE — Telephone Encounter (Signed)
Pt's medication was sent to pt's pharmacy as requested. Confirmation received.  °

## 2023-08-01 NOTE — Telephone Encounter (Signed)
*  STAT* If patient is at the pharmacy, call can be transferred to refill team.   1. Which medications need to be refilled? (please list name of each medication and dose if known)   rosuvastatin (CRESTOR) 20 MG tablet    2. Which pharmacy/location (including street and city if local pharmacy) is medication to be sent to?  [facility] - [place], [place] - [address] Phone: [phone]  Fax: [phone]      3. Do they need a 30 day or 90 day supply? 90

## 2023-08-22 ENCOUNTER — Telehealth: Payer: Self-pay

## 2023-08-22 DIAGNOSIS — Z01818 Encounter for other preprocedural examination: Secondary | ICD-10-CM

## 2023-08-22 NOTE — Telephone Encounter (Signed)
...     Pre-operative Risk Assessment    Patient Name: Jordan Payne  DOB: 1955-08-22 MRN: [id number]   Date of last office visit: 05/29/23 Date of next office visit: NONE   Request for Surgical Clearance    Procedure:   LUMBAR RFA  Date of Surgery:  Clearance TBD                                Surgeon:  DR Claria Dice Surgeon's Group or Practice Name:  [facility] Phone number:  [phone] Fax number:  [phone]   Type of Clearance Requested:   - Medical  - Pharmacy:  Hold Apixaban (Eliquis)     Type of Anesthesia:  Not Indicated   Additional requests/questions:    Jola Babinski   08/22/2023, 1:32 PM

## 2023-08-24 ENCOUNTER — Telehealth: Payer: Self-pay | Admitting: *Deleted

## 2023-08-24 NOTE — Telephone Encounter (Signed)
I s/w the pt and he says that surgeon's office told him that our office cleared him already. I did inform the pt that is incorrect and that we have not cleared him yet. Pt agreed to get lab work tomorrow, BMET/CBC at [facility]. Pt has been scheduled tele preop appt add on ok per preop APP Rise Paganini, NP. Pt  added on 08/31/23 tele preop appt.  Med rec and consent are done.     Patient Consent for Virtual Visit        Jordan Payne has provided verbal consent on 08/24/2023 for a virtual visit (video or telephone).   CONSENT FOR VIRTUAL VISIT FOR:  Jordan Payne  By participating in this virtual visit I agree to the following:  I hereby voluntarily request, consent and authorize [facility] and its employed or contracted physicians, physician assistants, nurse practitioners or other licensed health care professionals (the Practitioner), to provide me with telemedicine health care services (the "Services") as deemed necessary by the treating Practitioner. I acknowledge and consent to receive the Services by the Practitioner via telemedicine. I understand that the telemedicine visit will involve communicating with the Practitioner through live audiovisual communication technology and the disclosure of certain medical information by electronic transmission. I acknowledge that I have been given the opportunity to request an in-person assessment or other available alternative prior to the telemedicine visit and am voluntarily participating in the telemedicine visit.  I understand that I have the right to withhold or withdraw my consent to the use of telemedicine in the course of my care at any time, without affecting my right to future care or treatment, and that the Practitioner or I may terminate the telemedicine visit at any time. I understand that I have the right to inspect all information obtained and/or recorded in the course of the telemedicine visit and may receive copies of available  information for a reasonable fee.  I understand that some of the potential risks of receiving the Services via telemedicine include:  Delay or interruption in medical evaluation due to technological equipment failure or disruption; Information transmitted may not be sufficient (e.g. poor resolution of images) to allow for appropriate medical decision making by the Practitioner; and/or  In rare instances, security protocols could fail, causing a breach of personal health information.  Furthermore, I acknowledge that it is my responsibility to provide information about my medical history, conditions and care that is complete and accurate to the best of my ability. I acknowledge that Practitioner's advice, recommendations, and/or decision may be based on factors not within their control, such as incomplete or inaccurate data provided by me or distortions of diagnostic images or specimens that may result from electronic transmissions. I understand that the practice of medicine is not an exact science and that Practitioner makes no warranties or guarantees regarding treatment outcomes. I acknowledge that a copy of this consent can be made available to me via my patient portal [facility] MyChart), or I can request a printed copy by calling the office of [facility].    I understand that my insurance will be billed for this visit.   I have read or had this consent read to me. I understand the contents of this consent, which adequately explains the benefits and risks of the Services being provided via telemedicine.  I have been provided ample opportunity to ask questions regarding this consent and the Services and have had my questions answered to my satisfaction. I give my  informed consent for the services to be provided through the use of telemedicine in my medical care

## 2023-08-24 NOTE — Telephone Encounter (Signed)
I s/w the pt and he says that surgeon's office told him that our office cleared him already. I did inform the pt that is incorrect and that we have not cleared him yet. Pt agreed to get lab work tomorrow, BMET/CBC at [facility]. Pt has been scheduled tele preop appt add on ok per preop APP Rise Paganini, NP. Pt  added on 08/31/23 tele preop appt.  Med rec and consent are done.

## 2023-08-24 NOTE — Addendum Note (Signed)
Addended by: Tarri Fuller on: 08/24/2023 02:14 PM   Modules accepted: Orders

## 2023-08-24 NOTE — Telephone Encounter (Signed)
Patient with diagnosis of afib on Eliquis for anticoagulation.    Procedure: LUMBAR RFA  Date of procedure: TBD   CHA2DS2-VASc Score = 3   This indicates a 3.2% annual risk of stroke. The patient's score is based upon: CHF History: 0 HTN History: 1 Diabetes History: 0 Stroke History: 0 Vascular Disease History: 1 Age Score: 1 Gender Score: 0     CrCl -- Platelet count -- Need updated BMP and CBC  Per office protocol, patient can hold Eliquis for -- days prior to procedure.     **This guidance is not considered finalized until pre-operative APP has relayed final recommendations.**

## 2023-08-24 NOTE — Telephone Encounter (Signed)
I will forward back to preop APP to confirm if the pt will need a tele preop appt a few days after labs have been done. If so I will manage in one call.

## 2023-08-25 ENCOUNTER — Telehealth: Payer: Self-pay | Admitting: Cardiology

## 2023-08-25 NOTE — Telephone Encounter (Signed)
I s/w the pt and he is wondering if he really needed the lab work. He states going to [facility] now is "horrible, the wait time is terrible and the staff are not good at lab draws". I did explain the need for the lab work. Pt asked if there were any Lab Cops in [place]. I gave the pt the 2 addresses for the [employer]. Pt will get labs done.   Pt wants to ask Dr. Mayford Knife if he can occasionally take Ibuprofen. I assured the pt that I will send a message to Dr. Norris Cross nurse Alcario Drought to reach out to him about the Ibuprofen. Pt thanked me for the call back and the help.

## 2023-08-25 NOTE — Telephone Encounter (Signed)
Patient wants a call back to discuss if he needs to have lab work prior to getting his clearance.

## 2023-08-25 NOTE — Telephone Encounter (Signed)
I s/w the pt and assured him that I have manually faxed the lab orders over as well. Pt said the lab finally went ahead and did the lab draw. Per the pt her tells me the Lab tech told him they were confused b/c the orders were under a name [name]. I assured the pt yes that is the Pharmacists who placed the orders for labs for preop clearance.   Pt thanked me so much for my call and keeping him in the loop.

## 2023-08-26 LAB — CBC
Hematocrit: 47.5 % (ref 37.5–51.0)
Hemoglobin: 16 g/dL (ref 13.0–17.7)
MCH: 31.6 pg (ref 26.6–33.0)
MCHC: 33.7 g/dL (ref 31.5–35.7)
MCV: 94 fL (ref 79–97)
Platelets: 244 10*3/uL (ref 150–450)
RBC: 5.07 x10E6/uL (ref 4.14–5.80)
RDW: 11.9 % (ref 11.6–15.4)
WBC: 6.8 10*3/uL (ref 3.4–10.8)

## 2023-08-26 LAB — BASIC METABOLIC PANEL
BUN/Creatinine Ratio: 20 (ref 10–24)
BUN: 17 mg/dL (ref 8–27)
CO2: 21 mmol/L (ref 20–29)
Calcium: 10.2 mg/dL (ref 8.6–10.2)
Chloride: 105 mmol/L (ref 96–106)
Creatinine, Ser: 0.85 mg/dL (ref 0.76–1.27)
Glucose: 96 mg/dL (ref 70–99)
Potassium: 5.1 mmol/L (ref 3.5–5.2)
Sodium: 143 mmol/L (ref 134–144)
eGFR: 95 mL/min/{1.73_m2} (ref >59)

## 2023-08-30 NOTE — Progress Notes (Unsigned)
Virtual Visit via Telephone Note   Because of Jordan Payne co-morbid illnesses, he is at least at moderate risk for complications without adequate follow up.  This format is felt to be most appropriate for this patient at this time.  Due to technical limitations with video connection (technology), today's appointment will be conducted as an audio only telehealth visit, and Jordan Payne verbally agreed to proceed in this manner.   All issues noted in this document were discussed and addressed.  No physical exam could be performed with this format.  Evaluation Performed:  Preoperative cardiovascular risk assessment _____________   Date:  08/31/2023   Patient ID:  Jordan Payne, DOB [birth year], MRN [id number] Patient Location:  Home Provider location:   Office  Primary Care Provider:  Daisy Floro, MD Primary Cardiologist:  Jordan Magic, MD  Chief Complaint / Patient Profile   68 y.o. y/o male with a h/o MVP s/p MV repair, PAF followed by Dr. Nelly Payne and afib clinic status post ablation, HTN and DCM (EF normalized to 55% on echo 09/23/2015). He was sent for a sleep study due to PAF which showed severe OSA with an AHI of 32/hr and no significant central events. He underwent CPAP titration to 16cm H2O.   He is pending lumbar RFA by Dr. Regino Payne on 09/06/2023, and presents today for telephonic preoperative cardiovascular risk assessment.  History of Present Illness    Jordan Payne is a 68 y.o. male who presents via audio/video conferencing for a telehealth visit today.  Pt was last seen in cardiology clinic on 05/29/2023 by Dr. Mayford Payne.  At that time Jordan Payne was doing well .  The patient is now pending procedure as outlined above. Since his last visit, he has been doing well from a cardiac standpoint but is limited on strenuous activities due to chronic back injury and sequela associated with this disability. He is medically compliant. Has questions concerning taking NSAIDS after  procedure. He is advised that he can take the Ibuprofen for the first couple of days post procedure, but not daily as this can increase bleeding on Eliquis. He verbalizes understanding.   Past Medical History    Past Medical History:  Diagnosis Date   Agatston coronary artery calcium score greater than 400    coronary artery Ca score was 1274   Arthritis    DCM (dilated cardiomyopathy) (HCC)    EF 40-45% by echo 2015.  EF 55% by echo 2017. EF 50-55% by echo 2021   HLD (hyperlipidemia)    Hypertension    MVP (mitral valve prolapse)    s/p repair   OSA (obstructive sleep apnea)    PAF (paroxysmal atrial fibrillation) (HCC)    S/P afib ablation>>followed by Dr. Nelly Payne   Prostate cancer [facility])    PVC's (premature ventricular contractions)    Tibialis posterior rupture, right, initial encounter    right foot   Past Surgical History:  Procedure Laterality Date   APPENDECTOMY     ATRIAL FIBRILLATION ABLATION N/A 05/23/2022   Procedure: ATRIAL FIBRILLATION ABLATION;  Surgeon: Jordan Small, MD;  Location: [facility] INVASIVE CV LAB;  Service: Cardiovascular;  Laterality: N/A;   CARDIOVERSION N/A 03/29/2022   Procedure: CARDIOVERSION;  Surgeon: Jordan Poisson, MD;  Location: [facility] ENDOSCOPY;  Service: Cardiovascular;  Laterality: N/A;   PROSTATE BIOPSY     robotic mitral valve repair     TENDON TRANSPLANT     PT tendon transplant and calcaneal osteotomy   TONSILLECTOMY  Allergies  No Known Allergies  Home Medications    Prior to Admission medications   Medication Sig Start Date End Date Taking? Authorizing Provider  acetaminophen (TYLENOL) 500 MG tablet Take 1,000 mg by mouth daily as needed for moderate pain.    [provider]  apixaban (ELIQUIS) 5 MG TABS tablet Take 1 tablet (5 mg total) by mouth 2 (two) times daily. 05/29/23 05/29/24  Jordan Reichert, MD  Ascorbic Acid (VITAMIN C/ROSE HIPS TR) 1000 MG TBCR Take 1,000 mg by mouth in the morning.    [provider]  carvedilol (COREG) 12.5 MG tablet TAKE 1 TABLET TWICE A DAY WITH MEALS 07/14/23   Jordan Reichert, MD  Cholecalciferol (VITAMIN D3) 125 MCG (5000 UT) TABS Take 5,000 Units by mouth in the morning.    [provider]  Coenzyme Q10 (CO Q 10 PO) Take 100 mg by mouth daily.    [provider]  fexofenadine (ALLEGRA) 180 MG tablet Take 180 mg by mouth daily.    [provider]  Glucosamine-Chondroit-Calcium (TRIPLE FLEX BONE & JOINT PO) Take 1 tablet by mouth in the morning. Retail buyer Triple Best boy, Historical, MD  lisinopril (ZESTRIL) 10 MG tablet TAKE 1 TABLET DAILY 07/14/23   Jordan Reichert, MD  Magnesium 100 MG CAPS Take 1 capsule by mouth daily.    [provider]  Multiple Vitamin (MULTIVITAMIN WITH MINERALS) TABS tablet Take 1 tablet by mouth daily. [employer] Signature Adult 50+ Mature Multi Vitamins & Minerals    [provider]  rosuvastatin (CRESTOR) 20 MG tablet Take 1 tablet (20 mg total) by mouth daily. 08/01/23 10/30/23  Jordan Reichert, MD  TURMERIC PO Take 1,000 mg by mouth daily.    [provider]    Physical Exam    Vital Signs:  [name] does not have vital signs available for review today.   Given telephonic nature of communication, physical exam is limited. AAOx3. NAD. Normal affect.  Speech and respirations are unlabored.  Accessory Clinical Findings    None  Assessment & Plan    1.  Preoperative Cardiovascular Risk Assessment: According to the Revised Cardiac Risk Index (RCRI), his Perioperative Risk of Major Cardiac Event is (%): 0.9  His Functional Capacity in METs is: 7.25 according to the Duke Activity Status Index (DASI).   Per office protocol, patient can hold Eliquis for 3 days prior to procedure.    The patient was advised that if he develops new symptoms prior to surgery to contact our office to arrange for a follow-up visit, and he verbalized  understanding.  Therefore, based on ACC/AHA guidelines, patient would be at acceptable risk for the planned procedure without further cardiovascular testing. I will route this recommendation to the requesting party via Epic fax function.   A copy of this note will be routed to requesting surgeon.   Time:   Today, I have spent 15 minutes with the patient with telehealth technology discussing medical history, symptoms, and management plan.     Joni Reining, NP  08/31/2023, 10:46 AM

## 2023-08-30 NOTE — Telephone Encounter (Signed)
Patient with diagnosis of afib on Eliquis for anticoagulation.    Procedure: Lumbar RFA Date of procedure: TBD   CHA2DS2-VASc Score = 3   This indicates a 3.2% annual risk of stroke. The patient's score is based upon: CHF History: 0 HTN History: 1 Diabetes History: 0 Stroke History: 0 Vascular Disease History: 1 Age Score: 1 Gender Score: 0      CrCl 93 ml/min Platelet count 244  Per office protocol, patient can hold Eliquis for 3 days prior to procedure.     **This guidance is not considered finalized until pre-operative APP has relayed final recommendations.**

## 2023-08-30 NOTE — Telephone Encounter (Signed)
Please advise holding Eliquis prior to lumbar RFA. Labs have been updated. Virtual visit scheduled for 2/20.   Thank you!  DW

## 2023-08-31 ENCOUNTER — Ambulatory Visit: Payer: Medicare Other | Attending: Internal Medicine

## 2023-08-31 DIAGNOSIS — Z01818 Encounter for other preprocedural examination: Secondary | ICD-10-CM

## 2023-09-01 ENCOUNTER — Telehealth: Payer: Self-pay | Admitting: Cardiology

## 2023-09-01 NOTE — Telephone Encounter (Signed)
Jordan Payne with [facility] is following up regarding clearance request. Was appointment on 2/20 completed? Please advise.

## 2023-09-01 NOTE — Addendum Note (Signed)
Addended by: Jodelle Gross on: 09/01/2023 10:15 AM   Modules accepted: Level of Service

## 2023-09-01 NOTE — Telephone Encounter (Signed)
Tried calling office back to make them aware clearance has been sent to their office and I just resent it again in case they did not receive it the first time

## 2023-09-05 NOTE — Telephone Encounter (Signed)
 I s/w Maggie-surgery scheduler for Dr. Claria Dice. I assured that the pt has been cleared on 08/31/23 by Joni Reining, DNP; I stated I will re-fax notes and to please review the A&P section of the notes from Joni Reining, DNP:     Assessment & Plan    1.  Preoperative Cardiovascular Risk Assessment: According to the Revised Cardiac Risk Index (RCRI), his Perioperative Risk of Major Cardiac Event is (%): 0.9   His Functional Capacity in METs is: 7.25 according to the Duke Activity Status Index (DASI).    Per office protocol, patient can hold Eliquis for 3 days prior to procedure.     The patient was advised that if he develops new symptoms prior to surgery to contact our office to arrange for a follow-up visit, and he verbalized understanding.   Therefore, based on ACC/AHA guidelines, patient would be at acceptable risk for the planned procedure without further cardiovascular testing. I will route this recommendation to the requesting party via Epic fax function.    A copy of this note will be routed to requesting surgeon.    Time:   Today, I have spent 15 minutes with the patient with telehealth technology discussing medical history, symptoms, and management plan.       Joni Reining, NP   08/31/2023, 10:46 AM

## 2023-09-11 ENCOUNTER — Encounter: Payer: Self-pay | Admitting: Pulmonary Disease

## 2023-10-09 ENCOUNTER — Telehealth: Payer: Self-pay | Admitting: Cardiology

## 2023-10-09 NOTE — Telephone Encounter (Signed)
 Called pt in regards to rosuvastatin.   Reports aches with 20 mg dose.  Reports also has a hx of arthritis, pinched nerve and shoulder pain.  Doesn't recall pain with 10 mg dose that was on for years.  Pt wants to know if he should go back down to 10 mg or have OV with pharmacy clinic to discuss a different medication.  Pt is still taking 20 mg dose despite aches.   Advised pt will send to MD to advise.

## 2023-10-09 NOTE — Telephone Encounter (Signed)
 Pt c/o medication issue:  1. Name of Medication: rosuvastatin (CRESTOR) 20 MG tablet   2. How are you currently taking this medication (dosage and times per day)? As written   3. Are you having a reaction (difficulty breathing--STAT)? No   4. What is your medication issue? Pt called in stating he is still having joint pain with 20mg  daily. He would like to discuss switching to different med. Please advise.

## 2023-10-09 NOTE — Telephone Encounter (Signed)
 Called pt advised of MD response: Stop statin for 2 weeks and then call and let us know if pain stops   Pt expresses understanding.  Reports will have an MRI of shoulder in about 2 weeks which could also provide information regarding pain.

## 2024-01-08 ENCOUNTER — Ambulatory Visit
Admission: RE | Admit: 2024-01-08 | Discharge: 2024-01-08 | Disposition: A | Discharge status: Home / Self Care | Payer: Medicare Other | Source: Ambulatory Visit | Attending: Pulmonary Disease

## 2024-01-08 DIAGNOSIS — J849 Interstitial pulmonary disease, unspecified: Secondary | ICD-10-CM

## 2024-01-18 ENCOUNTER — Ambulatory Visit: Payer: Self-pay | Admitting: Pulmonary Disease

## 2024-01-22 ENCOUNTER — Telehealth: Payer: Self-pay | Admitting: Cardiology

## 2024-01-22 ENCOUNTER — Encounter: Payer: Self-pay | Admitting: Cardiovascular Disease
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
## 2024-01-22 NOTE — Telephone Encounter (Signed)
 Error

## 2024-01-22 NOTE — Telephone Encounter (Addendum)
 Pt scheduled with Daphne Aniceto PIETY on 02/01/2024.
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
## 2024-02-01 ENCOUNTER — Ambulatory Visit: Attending: Pulmonary Disease | Admitting: Pulmonary Disease

## 2024-02-01 ENCOUNTER — Encounter: Payer: Self-pay | Admitting: Pulmonary Disease

## 2024-02-01 VITALS — BP 120/86 | HR 63 | Ht 70.5 in | Wt 188.1 lb

## 2024-02-01 DIAGNOSIS — I4819 Other persistent atrial fibrillation: Secondary | ICD-10-CM | POA: Insufficient documentation

## 2024-02-01 DIAGNOSIS — I341 Nonrheumatic mitral (valve) prolapse: Secondary | ICD-10-CM | POA: Diagnosis not present

## 2024-02-01 DIAGNOSIS — D6869 Other thrombophilia: Secondary | ICD-10-CM | POA: Insufficient documentation

## 2024-02-01 DIAGNOSIS — G4733 Obstructive sleep apnea (adult) (pediatric): Secondary | ICD-10-CM | POA: Insufficient documentation

## 2024-02-01 NOTE — Patient Instructions (Signed)
 Medication Instructions:  Your physician recommends that you continue on your current medications as directed. Please refer to the Current Medication list given to you today.  *If you need a refill on your cardiac medications before your next appointment, please call your pharmacy*  Lab Work: None ordered If you have labs (blood work) drawn today and your tests are completely normal, you will receive your results only by: MyChart Message (if you have MyChart) OR A paper copy in the mail If you have any lab test that is abnormal or we need to change your treatment, we will call you to review the results.  Follow-Up: At [facility], you and your health needs are our priority.  As part of our continuing mission to provide you with exceptional heart care, our providers are all part of one team.  This team includes your primary Cardiologist (physician) and Advanced Practice Providers or APPs (Physician Assistants and Nurse Practitioners) who all work together to provide you with the care you need, when you need it.  Your next appointment:   9 month(s)  Provider:   You may see Eulas FORBES Furbish, MD or one of the following Advanced Practice Providers on your designated Care Team:   Charlies Arthur, [place] Ozell Jodie Passey, [name] Suzann Riddle, NP Daphne Barrack, NP

## 2024-02-19 ENCOUNTER — Ambulatory Visit (INDEPENDENT_AMBULATORY_CARE_PROVIDER_SITE_OTHER): Admitting: Pulmonary Disease

## 2024-02-19 ENCOUNTER — Encounter: Payer: Self-pay | Admitting: Pulmonary Disease

## 2024-02-19 VITALS — BP 145/77 | HR 67 | Ht 70.0 in | Wt 188.6 lb

## 2024-02-19 DIAGNOSIS — G4733 Obstructive sleep apnea (adult) (pediatric): Secondary | ICD-10-CM

## 2024-02-19 DIAGNOSIS — J849 Interstitial pulmonary disease, unspecified: Secondary | ICD-10-CM | POA: Diagnosis not present

## 2024-02-19 NOTE — Patient Instructions (Signed)
  VISIT SUMMARY: Today, we discussed your lung scarring and the findings from your recent abdominal imaging. We reviewed your history of pneumonia and COVID-19 infection, as well as your occupational and environmental exposures. We also talked about the tiny cysts found in your liver and spleen.  YOUR PLAN: -RIGHT LUNG POST-INFLAMMATORY SCARRING: You have mild scarring in your right lung, likely from past surgery, pneumonia or a COVID-19 infection. This scarring has not changed for at least a year and a half. Since you have no current respiratory symptoms and no significant exposures, no routine follow-up scan is needed. Please ensure your vaccinations, including the pneumonia vaccine, are up to date to help prevent future pneumonia.  -CYSTIC CHANGES IN LIVER AND SPLEEN: Tiny cysts were found in your liver and spleen, and they appear to be benign. You should discuss this with your primary care physician for further imaging of your abdomen. If there is a delay in following up with your primary care physician, consider getting a dedicated CT scan of your abdomen.  INSTRUCTIONS: Please follow up with your primary care physician to discuss the tiny cysts in your liver and spleen and to consider further imaging. Ensure your vaccinations, including the pneumonia vaccine, are up to date.                      Contains text generated by Abridge.                                 Contains text generated by Abridge.
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
## 2024-05-20 ENCOUNTER — Other Ambulatory Visit: Payer: Self-pay | Admitting: Cardiology

## 2024-05-20 DIAGNOSIS — E7800 Pure hypercholesterolemia, unspecified: Secondary | ICD-10-CM

## 2024-05-22 MED ORDER — ROSUVASTATIN CALCIUM 20 MG PO TABS: 20.0000 mg | ORAL_TABLET | Freq: Every day | ORAL | 1 refills | Status: DC

## 2024-07-08 ENCOUNTER — Telehealth: Payer: Self-pay | Admitting: Cardiology

## 2024-07-16 ENCOUNTER — Other Ambulatory Visit: Payer: Self-pay | Admitting: Cardiology

## 2024-07-16 ENCOUNTER — Telehealth: Payer: Self-pay

## 2024-07-16 NOTE — Telephone Encounter (Signed)
 Returned call to pt.  He is seeing Dr. Shlomo tomorrow, 07/17/24, and will ask them to send his refills in for the year to his mail in pharmacy, has pt has enough medications at the moment.  He was thankful for the follow-up phone call.

## 2024-07-16 NOTE — Telephone Encounter (Signed)
" °*  STAT* If patient is at the pharmacy, call can be transferred to refill team.   1. Which medications need to be refilled? (please list name of each medication and dose if known)   lisinopril  (ZESTRIL ) 10 MG tablet  carvedilol  (COREG ) 12.5 MG tablet   2. Would you like to learn more about the convenience, safety, & potential cost savings by using the [facility]?   3. Are you open to using the [facility] (Type [facility]. ).  4. Which pharmacy/location (including street and city if local pharmacy) is medication to be sent to?  [facility] - [place], [place] - [address]   5. Do they need a 30 day or 90 day supply?   90 day  Patient stated he still has some medication but will need the prescription going to [facility] to be for 90 days.  Patient has appointment scheduled with Dr. Shlomo on 1/7.  "

## 2024-07-16 NOTE — Telephone Encounter (Signed)
 SABRA

## 2024-07-17 ENCOUNTER — Encounter: Payer: Self-pay | Admitting: Cardiology

## 2024-07-17 ENCOUNTER — Ambulatory Visit: Attending: Cardiology | Admitting: Cardiology

## 2024-07-17 VITALS — BP 118/70 | HR 72 | Ht 70.0 in | Wt 193.8 lb

## 2024-07-17 DIAGNOSIS — I420 Dilated cardiomyopathy: Secondary | ICD-10-CM | POA: Diagnosis present

## 2024-07-17 DIAGNOSIS — G4733 Obstructive sleep apnea (adult) (pediatric): Secondary | ICD-10-CM | POA: Diagnosis present

## 2024-07-17 DIAGNOSIS — E7800 Pure hypercholesterolemia, unspecified: Secondary | ICD-10-CM | POA: Insufficient documentation

## 2024-07-17 DIAGNOSIS — I341 Nonrheumatic mitral (valve) prolapse: Secondary | ICD-10-CM | POA: Diagnosis not present

## 2024-07-17 DIAGNOSIS — Z79899 Other long term (current) drug therapy: Secondary | ICD-10-CM | POA: Insufficient documentation

## 2024-07-17 DIAGNOSIS — I4819 Other persistent atrial fibrillation: Secondary | ICD-10-CM | POA: Diagnosis present

## 2024-07-17 DIAGNOSIS — I10 Essential (primary) hypertension: Secondary | ICD-10-CM | POA: Diagnosis present

## 2024-07-17 DIAGNOSIS — I2510 Atherosclerotic heart disease of native coronary artery without angina pectoris: Secondary | ICD-10-CM | POA: Insufficient documentation

## 2024-07-17 MED ORDER — CARVEDILOL 12.5 MG PO TABS: 12.5000 mg | ORAL_TABLET | Freq: Two times a day (BID) | ORAL | 3 refills | Status: AC

## 2024-07-17 MED ORDER — LISINOPRIL 10 MG PO TABS: 10.0000 mg | ORAL_TABLET | Freq: Every day | ORAL | 3 refills | Status: AC

## 2024-07-17 MED ORDER — ROSUVASTATIN CALCIUM 20 MG PO TABS: 20.0000 mg | ORAL_TABLET | Freq: Every day | ORAL | 3 refills | Status: AC

## 2024-07-17 MED ORDER — APIXABAN 5 MG PO TABS: 5.0000 mg | ORAL_TABLET | Freq: Two times a day (BID) | ORAL | 3 refills | Status: AC

## 2024-07-17 NOTE — Progress Notes (Signed)
 "    Date:  07/17/2024   ID:  Jordan Payne, DOB 1956/05/06, MRN [id number] The patient was identified using 2 identifiers.  PCP:  Okey Carlin Redbird, MD   [facility] Providers Cardiologist:  Wilbert Bihari, MD Electrophysiologist:  Eulas FORBES Furbish, MD     Evaluation Performed:  Follow-Up Visit  Chief Complaint:  CAD, HTN, HLD, PAF  History of Present Illness:    Jordan Payne is a 69 y.o. male  male with a hx of MVP s/p MV repair, PAF followed by Dr. Furbish and afib clinic, HTN and DCM (EF normalized to 55% on echo 09/23/2015). He was sent for a sleep study due to PAF which showed severe OSA with an AHI of 32/hr and no significant central events. He underwent CPAP titration to 16cm H2O.    He is here today for follow-up and is doing well denies any chest pain or pressure, SOB, DOE, PND, orthopnea, lower extremity edema, dizziness, palpitation's or syncope.  He walks 2.7 miles daily with no problems.  He tends to get palpitations in the summer when he is outside working and gets hot.  Was seen by EP in July 2025 and rhythm strips showed PACs and PVCs.  He is doing well with his PAP device and thinks that he has gotten used to it.  He tolerates the full face mask and feels the pressure is adequate.  Since going on PAP he feels rested in the am and has no significant daytime sleepiness.  He denies any significant mouth or nasal dryness or nasal congestion.  He does not think that he snores. An Epworth Sleepiness Scale score was calculated the office today and this endorsed at 1 arguing against residual daytime sleepiness. Patient denies any episodes of bruxism, restless legs, No hypnogognic hallucinations or cataplectic events.    Past Medical History:  Diagnosis Date   Agatston coronary artery calcium  score greater than 400    coronary artery Ca score was 1274   Arthritis    DCM (dilated cardiomyopathy) (HCC)    EF 40-45% by echo 2015.  EF 55% by echo 2017. EF 50-55% by echo 2021    HLD (hyperlipidemia)    Hypertension    MVP (mitral valve prolapse)    s/p repair   OSA (obstructive sleep apnea)    PAF (paroxysmal atrial fibrillation) (HCC)    S/P afib ablation>>followed by Dr. Furbish   Prostate cancer [facility])    PVC's (premature ventricular contractions)    Tibialis posterior rupture, right, initial encounter    right foot   Past Surgical History:  Procedure Laterality Date   APPENDECTOMY     ATRIAL FIBRILLATION ABLATION N/A 05/23/2022   Procedure: ATRIAL FIBRILLATION ABLATION;  Surgeon: Furbish Eulas FORBES, MD;  Location: [facility] INVASIVE CV LAB;  Service: Cardiovascular;  Laterality: N/A;   CARDIOVERSION N/A 03/29/2022   Procedure: CARDIOVERSION;  Surgeon: Loni Soyla LABOR, MD;  Location: [facility] ENDOSCOPY;  Service: Cardiovascular;  Laterality: N/A;   PROSTATE BIOPSY     robotic mitral valve repair     TENDON TRANSPLANT     PT tendon transplant and calcaneal osteotomy   TONSILLECTOMY       Current Meds  Medication Sig   acetaminophen  (TYLENOL ) 500 MG tablet Take 1,000 mg by mouth daily as needed for moderate pain.   Ascorbic Acid (VITAMIN C/ROSE HIPS TR) 1000 MG TBCR Take 1,000 mg by mouth in the morning.   Bacillus Coagulans-Inulin (ALIGN PREBIOTIC-PROBIOTIC PO)    Cholecalciferol  (VITAMIN  D3) 125 MCG (5000 UT) TABS Take 5,000 Units by mouth in the morning.   Coenzyme Q10 (CO Q 10 PO) Take 100 mg by mouth daily.   fexofenadine (ALLEGRA) 180 MG tablet Take 180 mg by mouth daily.   Glucosamine-Chondroit-Calcium  (TRIPLE FLEX BONE & JOINT PO) Take 1 tablet by mouth in the morning. [place] Signature Triple Action Joint Health   Multiple Vitamin (MULTIVITAMIN WITH MINERALS) TABS tablet Take 1 tablet by mouth daily. [place] Signature Adult 50+ Mature Multi Vitamins & Minerals   TURMERIC PO Take 1,000 mg by mouth daily.   [DISCONTINUED] apixaban  (ELIQUIS ) 5 MG TABS tablet Take 1 tablet (5 mg total) by mouth 2 (two) times daily.   [DISCONTINUED] carvedilol  (COREG ) 12.5  MG tablet TAKE 1 TABLET TWICE A DAY WITH MEALS   [DISCONTINUED] lisinopril  (ZESTRIL ) 10 MG tablet TAKE 1 TABLET DAILY   [DISCONTINUED] rosuvastatin  (CRESTOR ) 20 MG tablet Take 1 tablet (20 mg total) by mouth daily.     Allergies:   Patient has no known allergies.   Social History   Tobacco Use   Smoking status: Never    Passive exposure: Past   Smokeless tobacco: Never  Vaping Use   Vaping status: Never Used  Substance Use Topics   Alcohol use: No    Alcohol/week: 0.0 standard drinks of alcohol   Drug use: No     Family Hx: The patient's family history includes Cancer in his mother; Deep vein thrombosis in his sister; Lymphoma in his maternal grandmother.  ROS:   Please see the history of present illness.     All other systems reviewed and are negative.   Prior CV studies:   The following studies were reviewed today:  PAP compliance download  Labs/Other Tests and Data Reviewed:     Recent Labs: 07/25/2023: ALT 18 08/25/2023: BUN 17; Creatinine, Ser 0.85; Hemoglobin 16.0; Platelets 244; Potassium 5.1; Sodium 143   Recent Lipid Panel Lab Results  Component Value Date/Time   CHOL 127 07/25/2023 09:18 AM   TRIG 85 07/25/2023 09:18 AM   HDL 39 (L) 07/25/2023 09:18 AM   CHOLHDL 3.3 07/25/2023 09:18 AM   LDLCALC 71 07/25/2023 09:18 AM    Wt Readings from Last 3 Encounters:  07/17/24 193 lb 12.8 oz (87.9 kg)  02/19/24 188 lb 9.6 oz (85.5 kg)  02/01/24 188 lb 1.6 oz (85.3 kg)     Risk Assessment/Calculations:    CHA2DS2-VASc Score = 3   This indicates a 3.2% annual risk of stroke. The patient's score is based upon: CHF History: 0 HTN History: 1 Diabetes History: 0 Stroke History: 0 Vascular Disease History: 1 Age Score: 1 Gender Score: 0     STOP-Bang Score:         Objective:    Vital Signs:  BP 118/70   Pulse 72   Ht 5' 10 (1.778 m)   Wt 193 lb 12.8 oz (87.9 kg)   SpO2 98%   BMI 27.81 kg/m   GEN: Well nourished, well developed in no acute  distress HEENT: Normal NECK: No JVD; No carotid bruits LYMPHATICS: No lymphadenopathy CARDIAC:RRR, no murmurs, rubs, gallops RESPIRATORY:  Clear to auscultation without rales, wheezing or rhonchi  ABDOMEN: Soft, non-tender, non-distended MUSCULOSKELETAL:  No edema; No deformity  SKIN: Warm and dry NEUROLOGIC:  Alert and oriented x 3 PSYCHIATRIC:  Normal affect  ASSESSMENT & PLAN:    OSA - The patient is tolerating PAP therapy well without any problems. The PAP download performed by his  DME was personally reviewed and interpreted by me today and showed an AHI of 0.7 /hr on auto CPAP from 4-15 cm H2O with 100 % compliance in using more than 4 hours nightly.  The patient has been using and benefiting from PAP use and will continue to benefit from therapy.   HTN -BP controlled on exam today at 118/70 mmHg - Continue carvedilol  12.5 mg twice daily, Zestril  10 mg daily with as needed refills  Mitral valve prolapse -Status post mitral valve repair 2003 -Echo 10/19/2022 showed stable annuloplasty ring with trivial MR and no evidence of mitral stenosis  ASCAD -Coronary calcium  score 1247 (93rd percentile cardiac CT 05/2022) -Normal stress PET CT 08/31/2022 with no ischemia or infarction -He has not had any anginal symptoms -No aspirin due to DOAC -Continue prescription drug management with carvedilol  12.5 mg twice daily and Crestor  40 mg daily with as needed refills  Hyperlipidemia -LDL goal less than 70 -I have personally reviewed and interpreted outside labs performed by patient's PCP which showed LDL 71 and HDL 39 on 07/31/2023 -Repeat FLP and ALT in February -Continue Crestor  20 mg daily with as needed refills  Dilated cardiomyopathy -EF low normal at 50 to 55% on echo 04/2023 with no change from previous echo 2021 -He is symptomatic and appears euvolemic on exam -Continue carvedilol  12.5 mg twice daily and lisinopril  10 mg daily with as needed refills   Persistent atrial  fibrillation -Status post A-fib ablation 05/30/2022 - He mains in normal sinus rhythm on exam and denies palpitation -he saw Dr. Nancey in August 2024 with concerns of palpitations triggered by hot weather and dehydration and his heart monitor showed PVCs but <3/hr and Dr. Nancey recommended to continue to monitor>>he does drink 5 ETOH drinks weekly and we discussed that this could trigger PVCs -Denies any major bleeding issues on DOAC -Continue apixaban  5 mg twice daily and carvedilol  12.5 mg twice daily with as needed refills -He continues to follow-up with EP -check BMET and CBC and Mag    Medication Adjustments/Labs and Tests Ordered: Current medicines are reviewed at length with the patient today.  Concerns regarding medicines are outlined above.   Tests Ordered: No orders of the defined types were placed in this encounter.   Medication Changes: Meds ordered this encounter  Medications   apixaban  (ELIQUIS ) 5 MG TABS tablet    Sig: Take 1 tablet (5 mg total) by mouth 2 (two) times daily.    Dispense:  180 tablet    Refill:  3   carvedilol  (COREG ) 12.5 MG tablet    Sig: Take 1 tablet (12.5 mg total) by mouth 2 (two) times daily with a meal.    Dispense:  180 tablet    Refill:  3   lisinopril  (ZESTRIL ) 10 MG tablet    Sig: Take 1 tablet (10 mg total) by mouth daily.    Dispense:  90 tablet    Refill:  3   rosuvastatin  (CRESTOR ) 20 MG tablet    Sig: Take 1 tablet (20 mg total) by mouth daily.    Dispense:  90 tablet    Refill:  3    Follow Up:  In Person in 1 year(s)  Signed, Wilbert Bihari, MD  07/17/2024 9:12 AM    [facility] "

## 2024-07-17 NOTE — Patient Instructions (Signed)
 Medication Instructions:  Your physician recommends that you continue on your current medications as directed. Please refer to the Current Medication list given to you today.  *If you need a refill on your cardiac medications before your next appointment, please call your pharmacy*  Lab Work: Please complete a CMET, CBC, FASTING lipid panel, Mag and CBC in February. You can go to any [facility] to complete,  just make sure you are fasting.  If you have labs (blood work) drawn today and your tests are completely normal, you will receive your results only by: MyChart Message (if you have MyChart) OR A paper copy in the mail If you have any lab test that is abnormal or we need to change your treatment, we will call you to review the results.  Testing/Procedures: None.  Follow-Up: At [facility], you and your health needs are our priority.  As part of our continuing mission to provide you with exceptional heart care, our providers are all part of one team.  This team includes your primary Cardiologist (physician) and Advanced Practice Providers or APPs (Physician Assistants and Nurse Practitioners) who all work together to provide you with the care you need, when you need it.  Your next appointment:   1 year(s)  Provider:   Wilbert Bihari, MD
